# Patient Record
Sex: Female | Born: 1955
Health system: Southern US, Community
[De-identification: ages and names within clinical notes are randomized; demographics above are authoritative.]

## PROBLEM LIST (undated history)

## (undated) DIAGNOSIS — E785 Hyperlipidemia, unspecified: Secondary | ICD-10-CM

## (undated) DIAGNOSIS — E0500 Thyrotoxicosis with diffuse goiter without thyrotoxic crisis or storm: Secondary | ICD-10-CM

## (undated) DIAGNOSIS — E0590 Thyrotoxicosis, unspecified without thyrotoxic crisis or storm: Secondary | ICD-10-CM

## (undated) DIAGNOSIS — G43909 Migraine, unspecified, not intractable, without status migrainosus: Secondary | ICD-10-CM

## (undated) HISTORY — DX: Thyrotoxicosis, unspecified without thyrotoxic crisis or storm: E05.90

## (undated) HISTORY — PX: TUBAL LIGATION: SHX77

## (undated) HISTORY — PX: BREAST ENHANCEMENT SURGERY: SHX7

## (undated) HISTORY — DX: Migraine, unspecified, not intractable, without status migrainosus: G43.909

## (undated) HISTORY — PX: AUGMENTATION MAMMAPLASTY: SUR837

---

## 1998-05-01 ENCOUNTER — Other Ambulatory Visit: Admission: RE | Admit: 1998-05-01 | Discharge: 1998-05-01 | Payer: Self-pay | Admitting: Obstetrics & Gynecology

## 1999-05-14 ENCOUNTER — Other Ambulatory Visit: Admission: RE | Admit: 1999-05-14 | Discharge: 1999-05-14 | Payer: Self-pay | Admitting: Obstetrics & Gynecology

## 2000-10-21 ENCOUNTER — Other Ambulatory Visit: Admission: RE | Admit: 2000-10-21 | Discharge: 2000-10-21 | Payer: Self-pay | Admitting: Obstetrics & Gynecology

## 2001-07-12 ENCOUNTER — Other Ambulatory Visit: Admission: RE | Admit: 2001-07-12 | Discharge: 2001-07-12 | Payer: Self-pay | Admitting: Gynecology

## 2001-12-21 ENCOUNTER — Other Ambulatory Visit: Admission: RE | Admit: 2001-12-21 | Discharge: 2001-12-21 | Payer: Self-pay | Admitting: Obstetrics & Gynecology

## 2003-05-09 ENCOUNTER — Other Ambulatory Visit: Admission: RE | Admit: 2003-05-09 | Discharge: 2003-05-09 | Payer: Self-pay | Admitting: Obstetrics & Gynecology

## 2004-07-09 ENCOUNTER — Other Ambulatory Visit: Admission: RE | Admit: 2004-07-09 | Discharge: 2004-07-09 | Payer: Self-pay | Admitting: Obstetrics & Gynecology

## 2009-10-29 ENCOUNTER — Emergency Department (HOSPITAL_BASED_OUTPATIENT_CLINIC_OR_DEPARTMENT_OTHER): Admission: EM | Admit: 2009-10-29 | Discharge: 2009-10-29 | Payer: Self-pay | Admitting: Emergency Medicine

## 2009-10-29 ENCOUNTER — Ambulatory Visit: Payer: Self-pay | Admitting: Diagnostic Radiology

## 2011-09-26 ENCOUNTER — Ambulatory Visit (INDEPENDENT_AMBULATORY_CARE_PROVIDER_SITE_OTHER): Payer: No Typology Code available for payment source

## 2011-09-26 DIAGNOSIS — Z23 Encounter for immunization: Secondary | ICD-10-CM

## 2012-05-18 ENCOUNTER — Other Ambulatory Visit: Payer: Self-pay | Admitting: Obstetrics & Gynecology

## 2012-05-18 DIAGNOSIS — Z1231 Encounter for screening mammogram for malignant neoplasm of breast: Secondary | ICD-10-CM

## 2012-06-03 ENCOUNTER — Ambulatory Visit
Admission: RE | Admit: 2012-06-03 | Discharge: 2012-06-03 | Disposition: A | Discharge status: Home / Self Care | Payer: No Typology Code available for payment source | Source: Ambulatory Visit | Attending: Obstetrics & Gynecology | Admitting: Obstetrics & Gynecology

## 2012-06-03 DIAGNOSIS — Z1231 Encounter for screening mammogram for malignant neoplasm of breast: Secondary | ICD-10-CM

## 2014-11-06 LAB — HM MAMMOGRAPHY

## 2014-11-06 LAB — HM PAP SMEAR: HM PAP: NORMAL

## 2014-11-10 ENCOUNTER — Other Ambulatory Visit: Payer: Self-pay

## 2014-11-10 DIAGNOSIS — Z1231 Encounter for screening mammogram for malignant neoplasm of breast: Secondary | ICD-10-CM

## 2014-11-22 ENCOUNTER — Ambulatory Visit
Admission: RE | Admit: 2014-11-22 | Discharge: 2014-11-22 | Disposition: A | Discharge status: Home / Self Care | Payer: PRIVATE HEALTH INSURANCE | Source: Ambulatory Visit

## 2014-11-22 ENCOUNTER — Encounter (INDEPENDENT_AMBULATORY_CARE_PROVIDER_SITE_OTHER): Payer: Self-pay

## 2014-11-22 DIAGNOSIS — Z1231 Encounter for screening mammogram for malignant neoplasm of breast: Secondary | ICD-10-CM

## 2015-01-12 ENCOUNTER — Other Ambulatory Visit (INDEPENDENT_AMBULATORY_CARE_PROVIDER_SITE_OTHER): Payer: PRIVATE HEALTH INSURANCE

## 2015-01-12 ENCOUNTER — Encounter: Payer: Self-pay | Admitting: Family Medicine

## 2015-01-12 ENCOUNTER — Other Ambulatory Visit: Payer: Self-pay | Admitting: Family Medicine

## 2015-01-12 ENCOUNTER — Telehealth: Payer: Self-pay | Admitting: Family Medicine

## 2015-01-12 ENCOUNTER — Ambulatory Visit (INDEPENDENT_AMBULATORY_CARE_PROVIDER_SITE_OTHER): Payer: PRIVATE HEALTH INSURANCE | Admitting: Family Medicine

## 2015-01-12 VITALS — BP 112/80 | HR 112 | Temp 98.1°F | Resp 16 | Ht 65.0 in | Wt 130.4 lb

## 2015-01-12 DIAGNOSIS — R0602 Shortness of breath: Secondary | ICD-10-CM | POA: Diagnosis not present

## 2015-01-12 DIAGNOSIS — R748 Abnormal levels of other serum enzymes: Secondary | ICD-10-CM

## 2015-01-12 DIAGNOSIS — R790 Abnormal level of blood mineral: Secondary | ICD-10-CM

## 2015-01-12 DIAGNOSIS — R5383 Other fatigue: Secondary | ICD-10-CM | POA: Insufficient documentation

## 2015-01-12 DIAGNOSIS — E0590 Thyrotoxicosis, unspecified without thyrotoxic crisis or storm: Secondary | ICD-10-CM

## 2015-01-12 LAB — CBC WITH DIFFERENTIAL/PLATELET
BASOS PCT: 0.5 % (ref 0.0–3.0)
Basophils Absolute: 0 10*3/uL (ref 0.0–0.1)
Eosinophils Absolute: 0.2 10*3/uL (ref 0.0–0.7)
Eosinophils Relative: 4.2 % (ref 0.0–5.0)
HCT: 41 % (ref 36.0–46.0)
HEMOGLOBIN: 13.7 g/dL (ref 12.0–15.0)
LYMPHS PCT: 36.1 % (ref 12.0–46.0)
Lymphs Abs: 1.5 10*3/uL (ref 0.7–4.0)
MCHC: 33.4 g/dL (ref 30.0–36.0)
MCV: 78.8 fl (ref 78.0–100.0)
MONOS PCT: 9.9 % (ref 3.0–12.0)
Monocytes Absolute: 0.4 10*3/uL (ref 0.1–1.0)
NEUTROS ABS: 2 10*3/uL (ref 1.4–7.7)
Neutrophils Relative %: 49.3 % (ref 43.0–77.0)
Platelets: 201 10*3/uL (ref 150.0–400.0)
RBC: 5.21 Mil/uL — AB (ref 3.87–5.11)
RDW: 12.8 % (ref 11.5–15.5)
WBC: 4 10*3/uL (ref 4.0–10.5)

## 2015-01-12 LAB — BASIC METABOLIC PANEL
BUN: 15 mg/dL (ref 6–23)
CALCIUM: 10.5 mg/dL (ref 8.4–10.5)
CHLORIDE: 106 meq/L (ref 96–112)
CO2: 30 mEq/L (ref 19–32)
CREATININE: 0.56 mg/dL (ref 0.40–1.20)
GFR: 118.01 mL/min (ref >60.00)
Glucose, Bld: 102 mg/dL — ABNORMAL HIGH (ref 70–99)
Potassium: 5 mEq/L (ref 3.5–5.1)
SODIUM: 141 meq/L (ref 135–145)

## 2015-01-12 LAB — HEPATIC FUNCTION PANEL
ALK PHOS: 210 U/L — AB (ref 39–117)
ALT: 40 U/L — ABNORMAL HIGH (ref 0–35)
AST: 45 U/L — AB (ref 0–37)
Albumin: 3.8 g/dL (ref 3.5–5.2)
BILIRUBIN DIRECT: 0.1 mg/dL (ref 0.0–0.3)
BILIRUBIN TOTAL: 0.4 mg/dL (ref 0.2–1.2)
Total Protein: 7.6 g/dL (ref 6.0–8.3)

## 2015-01-12 LAB — T4: T4, Total: 15.7 ug/dL — ABNORMAL HIGH (ref 4.5–12.0)

## 2015-01-12 LAB — TSH: TSH: 0.03 u[IU]/mL — ABNORMAL LOW (ref 0.35–4.50)

## 2015-01-12 LAB — T3: T3 TOTAL: 361.6 ng/dL — AB (ref 80.0–204.0)

## 2015-01-12 LAB — GAMMA GT: GGT: 104 U/L — ABNORMAL HIGH (ref 7–51)

## 2015-01-12 NOTE — Progress Notes (Signed)
Pre visit review using our clinic review tool, if applicable. No additional management support is needed unless otherwise documented below in the visit note. 

## 2015-01-12 NOTE — Assessment & Plan Note (Signed)
New.  Pt has always been active and recently has found herself short of breath w/ moderate exertion.  No hx of pulmonary issues.  No CP.  Pt does have strong family hx of early CAD.  Has not had cardiac evaluation.  Check labs to r/o anemia, thyroid or electrolyte disorder.  EKG WNL.  Refer for stress testing.  Reviewed supportive care and red flags that should prompt return.  Pt expressed understanding and is in agreement w/ plan.

## 2015-01-12 NOTE — Telephone Encounter (Signed)
Caller name: Samara SnideGraham, Ceniya S Relation to pt: self  Call back number: [phone]   Reason for call:   Pt stated she missed 2 calls today and she had lab work. Pt stated she will have her phone near.

## 2015-01-12 NOTE — Telephone Encounter (Signed)
Pt was not called, labs were sent to her mychart.

## 2015-01-12 NOTE — Patient Instructions (Signed)
Schedule your complete physical in 6 months We'll notify you of your lab results and stress test and determine if sooner followup or intervention is needed Keep up the good work on healthy diet and regular activity- ease back into it! Call with any questions or concerns Hang in there! Welcome!  We're glad to have you!

## 2015-01-12 NOTE — Progress Notes (Signed)
   Subjective:    Patient ID: Kristina Nolan, female    DOB: 1956-04-09, 59 y.o.   MRN: [id number]  HPI New to establish.  No previous MD.   Clayton BiblesGYNArlyce Dice- [facility].  UTD on mammo/pap.  Has never had colonoscopy.  Pt had appt scheduled w/ Dr Madilyn FiremanHayes at [facility] but had to reschedule due to weather.  SOB/fatigue- pt reports that 18 months she was exercising regularly but stopped.  Has lost 15 lbs in 6 months w/o exercise but was 'really watching what I ate'.  Recently pt is SOB w/ moderate yard work, walking upstairs.  No fevers.  Denies night sweats.  Denies CP w/ SOB.  Denies increased stress/anxiety.  Denies cough.  + dry skin.  No hair loss, constipation.  Pt has normally rapid heartbeat.  + family hx of heart disease- brother at 7940, dad died at 6036.   Review of Systems For ROS see HPI     Objective:   Physical Exam  Constitutional: She is oriented to person, place, and time. She appears well-developed and well-nourished. No distress.  HENT:  Head: Normocephalic and atraumatic.  Eyes: Conjunctivae and EOM are normal. Pupils are equal, round, and reactive to light.  Neck: Normal range of motion. Neck supple. No thyromegaly present.  Cardiovascular: Normal rate, regular rhythm, normal heart sounds and intact distal pulses.   No murmur heard. Pulmonary/Chest: Effort normal and breath sounds normal. No respiratory distress.  Abdominal: Soft. She exhibits no distension. There is no tenderness.  Musculoskeletal: She exhibits no edema.  Lymphadenopathy:    She has no cervical adenopathy.  Neurological: She is alert and oriented to person, place, and time.  Skin: Skin is warm and dry.  Psychiatric: She has a normal mood and affect. Her behavior is normal.  Vitals reviewed.         Assessment & Plan:

## 2015-01-12 NOTE — Assessment & Plan Note (Signed)
New.  Pt reports increased fatigue recently w/o cause.  Not getting formal exercise and admits to some component of deconditioning.  Check labs to r/o thyroid abnormality, anemia, or other metabolic cause.  Pt expressed understanding and is in agreement w/ plan.

## 2015-01-15 ENCOUNTER — Encounter: Payer: Self-pay | Admitting: Family Medicine

## 2015-01-15 ENCOUNTER — Other Ambulatory Visit: Payer: Self-pay | Admitting: Family Medicine

## 2015-01-15 ENCOUNTER — Other Ambulatory Visit (INDEPENDENT_AMBULATORY_CARE_PROVIDER_SITE_OTHER): Payer: PRIVATE HEALTH INSURANCE

## 2015-01-15 ENCOUNTER — Telehealth: Payer: Self-pay | Admitting: General Practice

## 2015-01-15 DIAGNOSIS — R748 Abnormal levels of other serum enzymes: Secondary | ICD-10-CM

## 2015-01-15 DIAGNOSIS — E0590 Thyrotoxicosis, unspecified without thyrotoxic crisis or storm: Secondary | ICD-10-CM

## 2015-01-15 LAB — HEPATITIS PANEL, ACUTE
HCV Ab: NEGATIVE
HEP B C IGM: NONREACTIVE
HEP B S AG: NEGATIVE
Hep A IgM: NONREACTIVE

## 2015-01-15 NOTE — Telephone Encounter (Signed)
Called pt and LMOVM. I was given papers from the LAB and advised that pt wanted me to give these to her sister this afternoon. There is nothing listed in the DPR and no phone note. Cannot give these without a verbal from the pt.

## 2015-01-15 NOTE — Telephone Encounter (Signed)
Pt had labs drawn this morning. Referral to endo and orders for US were placed today.

## 2015-01-15 NOTE — Telephone Encounter (Signed)
Chart updated per Mychart message from pt. Gave the ok to add sister Cindie LarocheCathy wright [facility].

## 2015-01-16 ENCOUNTER — Ambulatory Visit (HOSPITAL_BASED_OUTPATIENT_CLINIC_OR_DEPARTMENT_OTHER)
Admission: RE | Admit: 2015-01-16 | Discharge: 2015-01-16 | Disposition: A | Discharge status: Home / Self Care | Payer: 59 | Source: Ambulatory Visit | Attending: Family Medicine | Admitting: Family Medicine

## 2015-01-16 DIAGNOSIS — R748 Abnormal levels of other serum enzymes: Secondary | ICD-10-CM | POA: Diagnosis present

## 2015-01-16 DIAGNOSIS — E039 Hypothyroidism, unspecified: Secondary | ICD-10-CM | POA: Insufficient documentation

## 2015-01-18 ENCOUNTER — Encounter: Payer: Self-pay | Admitting: Family Medicine

## 2015-02-05 ENCOUNTER — Ambulatory Visit (INDEPENDENT_AMBULATORY_CARE_PROVIDER_SITE_OTHER): Payer: PRIVATE HEALTH INSURANCE | Admitting: Endocrinology

## 2015-02-05 ENCOUNTER — Encounter: Payer: Self-pay | Admitting: Endocrinology

## 2015-02-05 VITALS — BP 128/62 | HR 127 | Temp 97.8°F | Resp 16 | Ht 65.0 in | Wt 130.8 lb

## 2015-02-05 DIAGNOSIS — E0590 Thyrotoxicosis, unspecified without thyrotoxic crisis or storm: Secondary | ICD-10-CM | POA: Insufficient documentation

## 2015-02-05 MED ORDER — METHIMAZOLE 10 MG PO TABS: 10.0000 mg | ORAL_TABLET | Freq: Two times a day (BID) | ORAL | Status: DC

## 2015-02-05 MED ORDER — ATENOLOL 50 MG PO TABS: 50.0000 mg | ORAL_TABLET | Freq: Every day | ORAL | Status: DC

## 2015-02-05 NOTE — Progress Notes (Signed)
Patient ID: Kristina Nolan, female   DOB: 20-Jul-1956, 59 y.o.   MRN: [id number]                                                                                           Reason for Appointment:  Hyperthyroidism, new consultation  Referring physician: Beverely Lowabori   History of Present Illness:   Since 2-3 months the patient has had symptoms of palpitations, occasional shakiness, feeling excessively warm and sweaty, nervousness, and fatigue. She says she gets exhausted with relatively small amounts of activity and also gets short of breath with exertion.  This appears to be her major symptom.  She likes to exercise and has not been able to do much.  She will get tired and out of breath even going upstairs She does think her legs may be weaker. She has had some thinning of her hair. Also complains of generalized itching. The patient has lost 18 about  lbs since these symptoms started, does seem to have some decreased appetite recently and some altered taste   The patient was evaluated in 01/2015 by her PCP for her initial exam with thyroid function tests which showed the following:     Lab Results  Component Value Date   TSH 0.03* 01/12/2015   T4 level: 15.7, T3: 362 in 01/2015  She is now referred here for further management    Medication List       This list is accurate as of: 02/05/15  8:51 PM.  Always use your most recent med list.               atenolol 50 MG tablet  Commonly known as:  TENORMIN  Take 1 tablet (50 mg total) by mouth daily.     methimazole 10 MG tablet  Commonly known as:  TAPAZOLE  Take 1 tablet (10 mg total) by mouth 2 (two) times daily.            Past Medical History  Diagnosis Date  . Migraines     Past Surgical History  Procedure Laterality Date  . Tubal ligation    . Breast enhancement surgery      Family History  Problem Relation Age of Onset  . Cancer Mother     lung  . Cancer Father    adrenal  . Heart attack Father   . Heart attack Brother   . Thyroid disease Cousin                            Social History:  reports that she has never smoked. She does not have any smokeless tobacco history on file. She reports that she does not drink alcohol or use illicit drugs.  Allergies: No Known Allergies  Review of Systems:  Skin: Itching present, no rash  Has no  history of high blood pressure.       She has a history of dyspnea on exertion.    No chest pain  on exertion    No complaints of change in bowel habits.      There is no history of Diabetes.     No swelling of legs present      No recent symptoms of depression No frequency of urination She has heat intolerance but no night sweats No joint pains   Examination:   BP 128/62 mmHg  Pulse 127  Temp(Src) 97.8 F (36.6 C)  Resp 16  Ht  (1.651 m)  Wt 130 lb 12.8 oz (59.33 kg)  BMI 21.77 kg/m2  SpO2 97%   General Appearance:  well-built and nourished, pleasant, not anxious.  Mildly hyperkinetic     Eyes: No excessive prominence,has mild lid lag and stare. No swelling of the eyelids  Neck: The thyroid is enlarged times about 1-1/2 normal, smooth, non-tendersoft, mostly on the right side There is no lymphadenopathy .          Heart: normal S1 and S2, no murmurs .          Lungs: breath sounds are clear bilaterally Abdomen: no hepatosplenomegaly or other palpable abnormality  Extremities: hands are warm. No ankle edema. Neurological: Deep tendon reflexes at biceps are very brisk. Minimal fine tremors are present.    Assessment/Plan:   Hyperthyroidism, Likely to be from Graves' disease   She has had a recent onset of multiple overt hyperthyroid symptoms and has obvious clinical signs also She does have only a small goiter on exam Her hyperthyroidism appears to be moderate in severity as judged by clinical symptoms and thyroid function levels   Discussed with the patient the hyperthyroidism as being an  autoimmune thyroid disease.  Explained the options for treatment including antithyroid drugs and radioactive iodine. Discussed the pros and cons for each treatment: Antithyroid drugs would be reasonable for mild disease but would need frequent followup with lab monitoring as well as potential for side effects from the medications and uncertainty about long-term cure of the problem Discussed that I-131 treatment is safe and simple to do but will result in long-term hypothyroidism that will result from ablation of the thyroid tissue and the need for lifelong supplementation and periodic monitoring.  Currently the patient is undecided on the modality of treatment and is favoring antithyroid drugs at least initially. She will start with Tapazole 10 mg twice a day and follow-up at the end of the month with repeat free T4 level, doses be adjusted further  Also for symptomatic control of her palpitations, shakiness and anxiety will have her start atenolol 50 mg daily  She agrees that if we are unable to start tapering her medication off in the next 3-4 months she will do I-131 treatment. Patient understands the above discussion and treatment options. All questions were answered satisfactorily  [facility] 02/05/2015, 8:51 PM

## 2015-02-05 NOTE — Patient Instructions (Signed)
ANTITHYROID DRUGS    Antithyroid drugs (also called thionamides) are most often used to treat an overactive thyroid (hyperthyroidism) such as caused by Graves' disease. These drugs work by blocking the formation of thyroid hormone by the thyroid gland  Antithyroid drugs have several benefits and a few side effects. It is important to learn as much as possible about the treatment of Graves' disease and to discuss all of the possible effects of antithyroid drugs with your doctor     FUNCTION OF ANTITHYROID DRUGS - Antithyroid drugs decrease the levels of the two hormones produced by the thyroid, thyroxine (T4) and triiodothyronine (T3).   Antithyroid drugs may be used: ?As a short-term treatment in people with Graves' hyperthyroidism, to prepare for thyroid surgery or radioiodine. ?As a long-term treatment. Approximately 30 percent of people with Graves' disease will have a remission after prolonged treatment with antithyroid drugs.  ?To treat hyperthyroidism associated with toxic multinodular goiter or a toxic adenoma ("hot nodule"), usually to prepare for thyroid surgery or radioiodine.  ?To treat women with hyperthyroidism during pregnancy.  You will need to take antithyroid drugs for at least three weeks (usually six to eight weeks or longer) to lower thyroid hormone levels. This is because they only block formation of new thyroid hormone; they do not remove thyroid hormones that are already in the thyroid and the blood stream. If you frequently miss taking the antithyroid drug, thyroid hormone synthesis may resume quickly and replenish thyroid gland stores, prolonging or preventing adequate control of the hyperthyroidism.  TYPES OF ANTITHYROID DRUGS - Two antithyroid drugs are currently available in the [place]: propylthiouracil (PTU) and methimazole (MMI, Tapazole).   Methimazole (MMI) - MMI is usually preferred over PTU because it reverses hyperthyroidism more quickly and has  fewer side effects. MMI requires an average of six weeks to lower T4 levels to normal and is often given before radioactive iodine treatment. MMI can be taken once per day. Propylthiouracil (PTU) - PTU does not reverse hyperthyroidism as rapidly as MMI and it has more side effects. Because of its potential for liver damage, it is used only when MMI is not appropriate. PTU must be taken two to three times per the day.  Antithyroid drugs during pregnancy - PTU used to be the drug of choice during pregnancy because it causes less severe birth defects than methimazole. But experts now recommend that PTU be given during the first trimester only. This is because there have been rare cases of liver damage in people taking PTU. After the first trimester, women should switch to methimazole for the rest of the pregnancy. For women who are nursing, methimazole is probably a better choice than PTU (to avoid liver side effects). If you take antithyroid drugs, you should discuss your treatment with your doctor before becoming pregnant. Having radioiodine treatment at least six months before becoming pregnant can eliminate the need for antithyroid treatment during pregnancy.  Antithyroid drug side effects - Most of the side effects of antithyroid drugs are minor, but major side effects can occur. Because there is no way to predict who will experience side effects, it is important to discuss all possible side effects before starting treatment.  If you cannot tolerate antithyroid treatments, you can consider radioiodine treatment or surgery.  Minor side effects - Up to 15 percent of people who take an antithyroid drug have minor side effects. Both MMI and PTU can cause itching, rash, hives, joint pain and swelling, fever, changes in taste, nausea, and   vomiting. If one antithyroid drug causes side effects, switching to the other drug may be helpful. However, about half of people who have side effects with one drug will  have similar side effects with the other. Nausea and vomiting may depend on the dose; spreading large doses out through the day can reduce side effects.  Major side effects - Fortunately, the major side effects of antithyroid drugs are very rare. ?Agranulocytosis - Agranulocytosis is a term used to describe a severe decrease in the production of white blood cells. This condition is extremely serious, but affects only one out of every 200 to 500 people who take an antithyroid drug. Elderly people taking PTU and those who take high doses of MMI may be at higher risk of this side effect.  Agranulocytosis more commonly occurs within the first three months of starting treatment with an antithyroid drug, but can occur at any time. If you develop a sore throat, fever, or other signs or symptoms of infection, you should stop your medicine and immediately call your doctor or nurse to have a complete blood count (CBC). Serious and potentially life threatening infections, or even death, can occur before agranulocytosis resolves. However, once the antithyroid drug is stopped, agranulocytosis usually resolves within a week. ?Other - There are three other very rare complications of antithyroid drugs: liver damage (more common with PTU), aplastic anemia (failure of the bone marrow to produce blood cells), and vasculitis (inflammation of blood vessels associated with PTU).  PTU-related liver damage typically occurs within three months of starting the drug. If you develop jaundice, dark urine, light stools, abdominal pain, loss of appetite, nausea, or other evidence of liver dysfunction, you should discontinue the drug immediately and contact your clinician for assessment of liver function. PTU-related liver failure can be serious and potentially life threatening.  The risk of liver damage from PTU is an important concern, particularly in children. For this reason, MMI is the first choice for treating  hyperthyroidism.  MONITORING THYROID HORMONES DURING TREATMENT - During treatment, your blood thyroid hormone levels will be monitored periodically, between every 3-8 weeks . Antithyroid drugs typically reduce levels of both triiodothyronine (T3) and thyroxine (T4), but levels of T3 may take longer to return to normal. Thyroid-stimulating hormone (TSH) levels usually take the longest to return to normal. About 30 percent of people who take an antithyroid drug for one to two years will have prolonged remission of Graves' disease. It is not known if the antithyroid drug plays an active role in this remission or if it simply controls thyroid hormone levels until Graves' disease resolves on its own.  Checking for remission and recurrence - No test can reliably predict remission of Graves' disease. While imperfect, the measurement of TSH-receptor antibodies is widely used in the [place] and [place] to determine if a person is in remission. Usually, after one to two years of treatment, you will stop taking the antithyroid drug. Blood tests are usually performed two to three weeks after stopping the antithyroid drug. The blood tests are periodically repeated over six months to determine if hormone levels remain normal or increase over time (this is called a recurrence). If your TSH level is lower than normal, you have had a recurrence of Graves' disease. This can occur within 10 days of stopping antithyroid drug treatment, or it can occur several years later. If your levels of T3, T4, and TSH remain normal for six months, the prognosis is good. Recurrence after this time occurs in   only 8 to 10 percent of people.  

## 2015-02-14 ENCOUNTER — Encounter: Payer: Self-pay | Admitting: Family Medicine

## 2015-02-14 DIAGNOSIS — E0590 Thyrotoxicosis, unspecified without thyrotoxic crisis or storm: Secondary | ICD-10-CM

## 2015-02-14 NOTE — Telephone Encounter (Signed)
Referral placed.

## 2015-02-26 ENCOUNTER — Ambulatory Visit: Payer: No Typology Code available for payment source | Admitting: Family Medicine

## 2015-03-14 ENCOUNTER — Other Ambulatory Visit (INDEPENDENT_AMBULATORY_CARE_PROVIDER_SITE_OTHER): Payer: PRIVATE HEALTH INSURANCE

## 2015-03-14 DIAGNOSIS — E0590 Thyrotoxicosis, unspecified without thyrotoxic crisis or storm: Secondary | ICD-10-CM | POA: Diagnosis not present

## 2015-03-14 LAB — T4, FREE: Free T4: 0.4 ng/dL — ABNORMAL LOW (ref 0.60–1.60)

## 2015-03-15 ENCOUNTER — Telehealth: Payer: Self-pay | Admitting: Family Medicine

## 2015-03-15 NOTE — Telephone Encounter (Signed)
Ov printed and faxed 03-15-15.

## 2015-03-15 NOTE — Telephone Encounter (Signed)
Caller name: Chenita at [facility] Ph# [phone] Fax# [phone]  Reason for call: Please fax copies of the last 2 office visits with notes and lab results as we have referred the pt to them.

## 2015-03-16 ENCOUNTER — Encounter: Payer: Self-pay | Admitting: Endocrinology

## 2015-03-16 ENCOUNTER — Ambulatory Visit (INDEPENDENT_AMBULATORY_CARE_PROVIDER_SITE_OTHER): Payer: PRIVATE HEALTH INSURANCE | Admitting: Endocrinology

## 2015-03-16 ENCOUNTER — Other Ambulatory Visit (INDEPENDENT_AMBULATORY_CARE_PROVIDER_SITE_OTHER): Payer: PRIVATE HEALTH INSURANCE

## 2015-03-16 ENCOUNTER — Other Ambulatory Visit: Payer: PRIVATE HEALTH INSURANCE

## 2015-03-16 VITALS — BP 118/66 | HR 75 | Temp 98.0°F | Resp 14 | Wt 139.4 lb

## 2015-03-16 DIAGNOSIS — R945 Abnormal results of liver function studies: Secondary | ICD-10-CM | POA: Diagnosis not present

## 2015-03-16 DIAGNOSIS — E0590 Thyrotoxicosis, unspecified without thyrotoxic crisis or storm: Secondary | ICD-10-CM | POA: Diagnosis not present

## 2015-03-16 NOTE — Patient Instructions (Signed)
Take atenolol 1/2 daily for 1 week then stop  Methimazole at dinner only: 1/2 alternating with 1 tab with food

## 2015-03-16 NOTE — Progress Notes (Signed)
Patient ID: Kristina Nolan, female   DOB: [birth year], 59 y.o.   MRN: [id number]                                                                                           Reason for Appointment:  Hyperthyroidism, follow-up  Referring physician: Beverely Low   History of Present Illness:   She presented with a 2 to three-month history of palpitations, occasional shakiness, feeling excessively warm, sweaty, nervousness, and fatigue. She worked get tired and out of breath even going upstairs. Also complains of generalized itching and 18 pound weight loss.  The patient was evaluated in 01/2015 by her PCP for her initial exam with thyroid function tests which showed a low TSH along with a total T3 of 362 and total T4 of 15.7 in 01/2015    She was not eager to consider I-131 treatment and has been started on methimazole 10 mg twice a day After about 2 weeks of starting the medication the patient started having significant improvement in her symptoms as above and currently feels back to normal with no fatigue, palpitations or heat intolerance She continues to take atenolol 50 mg daily  Follow-up labs are as follows:  Lab Results  Component Value Date   TSH 0.03* 01/12/2015   FREET4 0.40* 03/14/2015        Medication List       This list is accurate as of: 03/16/15  2:42 PM.  Always use your most recent med list.               atenolol 50 MG tablet  Commonly known as:  TENORMIN  Take 1 tablet (50 mg total) by mouth daily.     methimazole 10 MG tablet  Commonly known as:  TAPAZOLE  Take 1 tablet (10 mg total) by mouth 2 (two) times daily.     valACYclovir 500 MG tablet  Commonly known as:  VALTREX  TAKE 1 TABLET(S) EVERY DAY BY ORAL ROUTE.            Past Medical History  Diagnosis Date  . Migraines     Past Surgical History  Procedure Laterality Date  . Tubal ligation    . Breast enhancement surgery      Family History    Problem Relation Age of Onset  . Cancer Mother     lung  . Cancer Father     adrenal  . Heart attack Father   . Heart attack Brother   . Thyroid disease Cousin                            Social History:  reports that she has never smoked. She does not have any smokeless tobacco history on file. She reports that she does not drink alcohol or use illicit drugs.  Allergies: No Known Allergies  Review of Systems:     Examination:   BP 118/66 mmHg  Pulse 75  Temp(Src) 98 F (  36.7 C)  Resp 14  Wt 139 lb 6.4 oz (63.231 kg)  SpO2 97%  She is not anxious  Neck: The thyroid is nonpalpable Neurological: Deep tendon reflexes at biceps are slightly brisk No tremor     Assessment/Plan:   Hyperthyroidism, Likely to be from Graves' disease   She has had  excellent improvement in her symptoms with Tapazole 10 mg twice a day Although she had significantly high  pretreatment levels of T3 and also some increase in T4 levels she has responded with significantly lower free T4 levels, now in the hypothyroid range Also her thyroid enlargement appears to have resolved  She is she has responded somewhat dramatically to taking 20 mg Tapazole is unlikely that she has severe disease and may be able to get into remission with antithyroid drugs alone. Will reduce her methimazole to 7.5 mg daily in an also taper off her atenolol  She will follow-up in 4 weeks for reassessment   Roderica Cathell 03/16/2015, 2:42 PM

## 2015-03-19 LAB — HEPATIC FUNCTION PANEL
ALBUMIN: 4 g/dL (ref 3.5–5.2)
ALT: 20 U/L (ref 0–35)
AST: 33 U/L (ref 0–37)
Alkaline Phosphatase: 170 U/L — ABNORMAL HIGH (ref 39–117)
Bilirubin, Direct: 0 mg/dL (ref 0.0–0.3)
TOTAL PROTEIN: 7.4 g/dL (ref 6.0–8.3)
Total Bilirubin: 0.3 mg/dL (ref 0.2–1.2)

## 2015-04-10 ENCOUNTER — Other Ambulatory Visit: Payer: PRIVATE HEALTH INSURANCE

## 2015-04-16 ENCOUNTER — Ambulatory Visit: Payer: PRIVATE HEALTH INSURANCE | Admitting: Endocrinology

## 2015-05-02 ENCOUNTER — Other Ambulatory Visit: Payer: Self-pay | Admitting: Endocrinology

## 2015-05-23 ENCOUNTER — Telehealth: Payer: Self-pay | Admitting: Family Medicine

## 2015-05-23 NOTE — Telephone Encounter (Signed)
pre visit letter mailed 05/23/15 °

## 2015-06-12 ENCOUNTER — Telehealth: Payer: Self-pay | Admitting: Behavioral Health

## 2015-06-12 NOTE — Telephone Encounter (Signed)
Unable to reach patient at time of Pre-Visit Call.  Left message for patient to return call when available.    

## 2015-06-13 ENCOUNTER — Ambulatory Visit (INDEPENDENT_AMBULATORY_CARE_PROVIDER_SITE_OTHER): Payer: PRIVATE HEALTH INSURANCE | Admitting: Family Medicine

## 2015-06-13 ENCOUNTER — Encounter: Payer: PRIVATE HEALTH INSURANCE | Admitting: Family Medicine

## 2015-06-13 ENCOUNTER — Encounter: Payer: Self-pay | Admitting: Family Medicine

## 2015-06-13 VITALS — BP 120/72 | HR 88 | Temp 97.8°F | Resp 16 | Ht 65.0 in | Wt 148.2 lb

## 2015-06-13 DIAGNOSIS — Z1211 Encounter for screening for malignant neoplasm of colon: Secondary | ICD-10-CM

## 2015-06-13 DIAGNOSIS — Z0000 Encounter for general adult medical examination without abnormal findings: Secondary | ICD-10-CM

## 2015-06-13 DIAGNOSIS — E0590 Thyrotoxicosis, unspecified without thyrotoxic crisis or storm: Secondary | ICD-10-CM

## 2015-06-13 LAB — BASIC METABOLIC PANEL
BUN: 12 mg/dL (ref 6–23)
CALCIUM: 9.8 mg/dL (ref 8.4–10.5)
CO2: 32 mEq/L (ref 19–32)
Chloride: 104 mEq/L (ref 96–112)
Creatinine, Ser: 0.74 mg/dL (ref 0.40–1.20)
GFR: 85.43 mL/min (ref >60.00)
GLUCOSE: 88 mg/dL (ref 70–99)
Potassium: 4.7 mEq/L (ref 3.5–5.1)
SODIUM: 142 meq/L (ref 135–145)

## 2015-06-13 LAB — CBC WITH DIFFERENTIAL/PLATELET
BASOS ABS: 0 10*3/uL (ref 0.0–0.1)
Basophils Relative: 0.8 % (ref 0.0–3.0)
Eosinophils Absolute: 0.2 10*3/uL (ref 0.0–0.7)
Eosinophils Relative: 4.1 % (ref 0.0–5.0)
HEMATOCRIT: 41 % (ref 36.0–46.0)
Hemoglobin: 13.5 g/dL (ref 12.0–15.0)
LYMPHS PCT: 32.5 % (ref 12.0–46.0)
Lymphs Abs: 1.2 10*3/uL (ref 0.7–4.0)
MCHC: 32.9 g/dL (ref 30.0–36.0)
MCV: 82.9 fl (ref 78.0–100.0)
MONOS PCT: 8.5 % (ref 3.0–12.0)
Monocytes Absolute: 0.3 10*3/uL (ref 0.1–1.0)
NEUTROS ABS: 2 10*3/uL (ref 1.4–7.7)
Neutrophils Relative %: 54.1 % (ref 43.0–77.0)
Platelets: 206 10*3/uL (ref 150.0–400.0)
RBC: 4.94 Mil/uL (ref 3.87–5.11)
RDW: 14.1 % (ref 11.5–15.5)
WBC: 3.7 10*3/uL — ABNORMAL LOW (ref 4.0–10.5)

## 2015-06-13 LAB — T4, FREE: FREE T4: 0.83 ng/dL (ref 0.60–1.60)

## 2015-06-13 LAB — LIPID PANEL
CHOL/HDL RATIO: 3
CHOLESTEROL: 242 mg/dL — AB (ref 0–200)
HDL: 70.9 mg/dL (ref >39.00)
LDL CALC: 155 mg/dL — AB (ref 0–99)
NONHDL: 171.09
Triglycerides: 80 mg/dL (ref 0.0–149.0)
VLDL: 16 mg/dL (ref 0.0–40.0)

## 2015-06-13 LAB — T3, FREE: T3, Free: 3.3 pg/mL (ref 2.3–4.2)

## 2015-06-13 LAB — HEPATIC FUNCTION PANEL
ALBUMIN: 4.2 g/dL (ref 3.5–5.2)
ALK PHOS: 170 U/L — AB (ref 39–117)
ALT: 36 U/L — ABNORMAL HIGH (ref 0–35)
AST: 40 U/L — ABNORMAL HIGH (ref 0–37)
Bilirubin, Direct: 0.1 mg/dL (ref 0.0–0.3)
Total Bilirubin: 0.3 mg/dL (ref 0.2–1.2)
Total Protein: 7.6 g/dL (ref 6.0–8.3)

## 2015-06-13 LAB — VITAMIN D 25 HYDROXY (VIT D DEFICIENCY, FRACTURES): VITD: 24.85 ng/mL — ABNORMAL LOW (ref 30.00–100.00)

## 2015-06-13 LAB — TSH: TSH: 1.24 u[IU]/mL (ref 0.35–4.50)

## 2015-06-13 NOTE — Assessment & Plan Note (Signed)
Pt's PE WNL.  UTD on mammo, pap.  Due for colonoscopy.  Pt plans to schedule w/ [facility].  Check labs.  Anticipatory guidance provided.

## 2015-06-13 NOTE — Progress Notes (Signed)
Pre visit review using our clinic review tool, if applicable. No additional management support is needed unless otherwise documented below in the visit note. 

## 2015-06-13 NOTE — Progress Notes (Signed)
   Subjective:    Patient ID: Kristina Nolan, female    DOB: December 19, 1955, 59 y.o.   MRN: [id number]  HPI CPE- UTD on mammo, pap w/ Dr Arlyce Dice.  Seeing Dr Cleaster Corin ([facility]) for hyperthyroid.  Due for colonoscopy (Had appt w/ [facility]).     Review of Systems Patient reports no vision/ hearing changes, adenopathy,fever, weight change,  persistant/recurrent hoarseness , swallowing issues, chest pain, palpitations, edema, persistant/recurrent cough, hemoptysis, dyspnea (rest/exertional/paroxysmal nocturnal), gastrointestinal bleeding (melena, rectal bleeding), abdominal pain, significant heartburn, bowel changes, GU symptoms (dysuria, hematuria, incontinence), Gyn symptoms (abnormal  bleeding, pain),  syncope, focal weakness, memory loss, numbness & tingling, skin/hair/nail changes, abnormal bruising or bleeding, anxiety, or depression.     Objective:   Physical Exam General Appearance:    Alert, cooperative, no distress, appears stated age  Head:    Normocephalic, without obvious abnormality, atraumatic  Eyes:    PERRL, conjunctiva/corneas clear, EOM's intact, fundi    benign, both eyes  Ears:    Normal TM's and external ear canals, both ears  Nose:   Nares normal, septum midline, mucosa normal, no drainage    or sinus tenderness  Throat:   Lips, mucosa, and tongue normal; teeth and gums normal  Neck:   Supple, symmetrical, trachea midline, no adenopathy;    Thyroid: no enlargement/tenderness/nodules  Back:     Symmetric, no curvature, ROM normal, no CVA tenderness  Lungs:     Clear to auscultation bilaterally, respirations unlabored  Chest Wall:    No tenderness or deformity   Heart:    Regular rate and rhythm, S1 and S2 normal, no murmur, rub   or gallop  Breast Exam:    Deferred to GYN  Abdomen:     Soft, non-tender, bowel sounds active all four quadrants,    no masses, no organomegaly  Genitalia:    Deferred to GYN  Rectal:    Extremities:   Extremities normal, atraumatic, no cyanosis or  edema  Pulses:   2+ and symmetric all extremities  Skin:   Skin color, texture, turgor normal, no rashes or lesions  Lymph nodes:   Cervical, supraclavicular, and axillary nodes normal  Neurologic:   CNII-XII intact, normal strength, sensation and reflexes    throughout          Assessment & Plan:

## 2015-06-13 NOTE — Assessment & Plan Note (Signed)
Following at [facility].  Due for T3/T4 and TSH.  Orders entered.  Will fax once available.

## 2015-06-13 NOTE — Patient Instructions (Signed)
Follow up in 1 year or as needed We'll notify you of your lab results and make any changes if needed Call and schedule a colonoscopy consultation w/ GI Keep up the good work on healthy diet and try and get regular exercise Call with any questions or concerns Have a great fall!!

## 2015-06-15 ENCOUNTER — Other Ambulatory Visit: Payer: Self-pay | Admitting: General Practice

## 2015-06-15 DIAGNOSIS — E785 Hyperlipidemia, unspecified: Secondary | ICD-10-CM

## 2015-06-15 DIAGNOSIS — R748 Abnormal levels of other serum enzymes: Secondary | ICD-10-CM

## 2015-06-15 MED ORDER — VITAMIN D (ERGOCALCIFEROL) 1.25 MG (50000 UNIT) PO CAPS: 50000.0000 [IU] | ORAL_CAPSULE | ORAL | Status: DC

## 2015-06-15 MED ORDER — SIMVASTATIN 20 MG PO TABS: 20.0000 mg | ORAL_TABLET | Freq: Every day | ORAL | Status: DC

## 2015-07-11 ENCOUNTER — Other Ambulatory Visit: Payer: Self-pay | Admitting: Endocrinology

## 2015-08-03 ENCOUNTER — Encounter: Payer: Self-pay | Admitting: Family Medicine

## 2015-08-03 NOTE — Telephone Encounter (Signed)
Pease advise, per our records pt was to repeat LFT's in 6-8 weeks (last ov 06/13/15).   [facility] and just had Vitamin D labs on 06/13/15.

## 2015-08-08 DIAGNOSIS — E0500 Thyrotoxicosis with diffuse goiter without thyrotoxic crisis or storm: Secondary | ICD-10-CM | POA: Insufficient documentation

## 2016-02-12 ENCOUNTER — Other Ambulatory Visit: Payer: Self-pay

## 2016-02-12 DIAGNOSIS — Z1231 Encounter for screening mammogram for malignant neoplasm of breast: Secondary | ICD-10-CM

## 2016-02-25 ENCOUNTER — Ambulatory Visit
Admission: RE | Admit: 2016-02-25 | Discharge: 2016-02-25 | Disposition: A | Discharge status: Home / Self Care | Payer: No Typology Code available for payment source | Source: Ambulatory Visit

## 2016-02-25 DIAGNOSIS — Z1231 Encounter for screening mammogram for malignant neoplasm of breast: Secondary | ICD-10-CM

## 2016-06-16 ENCOUNTER — Encounter: Payer: Self-pay | Admitting: Family Medicine

## 2016-06-16 ENCOUNTER — Ambulatory Visit (INDEPENDENT_AMBULATORY_CARE_PROVIDER_SITE_OTHER): Payer: No Typology Code available for payment source | Admitting: Family Medicine

## 2016-06-16 ENCOUNTER — Encounter: Payer: Self-pay | Admitting: General Practice

## 2016-06-16 VITALS — BP 102/62 | HR 76 | Temp 98.1°F | Resp 16 | Ht 65.0 in | Wt 143.1 lb

## 2016-06-16 DIAGNOSIS — Z0000 Encounter for general adult medical examination without abnormal findings: Secondary | ICD-10-CM | POA: Diagnosis not present

## 2016-06-16 DIAGNOSIS — Z23 Encounter for immunization: Secondary | ICD-10-CM

## 2016-06-16 LAB — CBC WITH DIFFERENTIAL/PLATELET
BASOS ABS: 0 10*3/uL (ref 0.0–0.1)
Basophils Relative: 0.6 % (ref 0.0–3.0)
EOS PCT: 2.1 % (ref 0.0–5.0)
Eosinophils Absolute: 0.1 10*3/uL (ref 0.0–0.7)
HCT: 41 % (ref 36.0–46.0)
HEMOGLOBIN: 14 g/dL (ref 12.0–15.0)
LYMPHS PCT: 27.7 % (ref 12.0–46.0)
Lymphs Abs: 1.7 10*3/uL (ref 0.7–4.0)
MCHC: 34.2 g/dL (ref 30.0–36.0)
MCV: 80.8 fl (ref 78.0–100.0)
MONOS PCT: 7 % (ref 3.0–12.0)
Monocytes Absolute: 0.4 10*3/uL (ref 0.1–1.0)
Neutro Abs: 3.9 10*3/uL (ref 1.4–7.7)
Neutrophils Relative %: 62.6 % (ref 43.0–77.0)
Platelets: 211 10*3/uL (ref 150.0–400.0)
RBC: 5.08 Mil/uL (ref 3.87–5.11)
RDW: 13.4 % (ref 11.5–15.5)
WBC: 6.2 10*3/uL (ref 4.0–10.5)

## 2016-06-16 LAB — LIPID PANEL
CHOLESTEROL: 243 mg/dL — AB (ref 0–200)
HDL: 65.7 mg/dL (ref >39.00)
LDL Cholesterol: 150 mg/dL — ABNORMAL HIGH (ref 0–99)
NonHDL: 177.57
Total CHOL/HDL Ratio: 4
Triglycerides: 140 mg/dL (ref 0.0–149.0)
VLDL: 28 mg/dL (ref 0.0–40.0)

## 2016-06-16 LAB — HEPATIC FUNCTION PANEL
ALBUMIN: 4.5 g/dL (ref 3.5–5.2)
ALT: 42 U/L — ABNORMAL HIGH (ref 0–35)
AST: 42 U/L — ABNORMAL HIGH (ref 0–37)
Alkaline Phosphatase: 122 U/L — ABNORMAL HIGH (ref 39–117)
Bilirubin, Direct: 0.1 mg/dL (ref 0.0–0.3)
Total Bilirubin: 0.4 mg/dL (ref 0.2–1.2)
Total Protein: 7.5 g/dL (ref 6.0–8.3)

## 2016-06-16 LAB — BASIC METABOLIC PANEL
BUN: 16 mg/dL (ref 6–23)
CO2: 31 meq/L (ref 19–32)
Calcium: 9.9 mg/dL (ref 8.4–10.5)
Chloride: 103 mEq/L (ref 96–112)
Creatinine, Ser: 0.71 mg/dL (ref 0.40–1.20)
GFR: 89.3 mL/min (ref >60.00)
GLUCOSE: 81 mg/dL (ref 70–99)
POTASSIUM: 4.7 meq/L (ref 3.5–5.1)
SODIUM: 140 meq/L (ref 135–145)

## 2016-06-16 LAB — TSH: TSH: 1.99 u[IU]/mL (ref 0.35–4.50)

## 2016-06-16 LAB — VITAMIN D 25 HYDROXY (VIT D DEFICIENCY, FRACTURES): VITD: 33.89 ng/mL (ref 30.00–100.00)

## 2016-06-16 NOTE — Progress Notes (Signed)
Pre visit review using our clinic review tool, if applicable. No additional management support is needed unless otherwise documented below in the visit note. 

## 2016-06-16 NOTE — Progress Notes (Signed)
   Subjective:    Patient ID: Kristina Nolan, female    DOB: Jul 17, 1956, 60 y.o.   MRN: [id number]  HPI CPE- UTD on pap, mammo.  Due for colonoscopy (pt had this scheduled but MD needed to change appt- she plans to reschedule) and flu shot.       Review of Systems Patient reports no vision/ hearing changes, adenopathy,fever, weight change,  persistant/recurrent hoarseness , swallowing issues, chest pain, palpitations, edema, persistant/recurrent cough, hemoptysis, dyspnea (rest/exertional/paroxysmal nocturnal), gastrointestinal bleeding (melena, rectal bleeding), abdominal pain, significant heartburn, bowel changes, GU symptoms (dysuria, hematuria, incontinence), Gyn symptoms (abnormal  bleeding, pain),  syncope, focal weakness, memory loss, numbness & tingling, skin/hair/nail changes, abnormal bruising or bleeding, anxiety, or depression.     Objective:   Physical Exam General Appearance:    Alert, cooperative, no distress, appears stated age  Head:    Normocephalic, without obvious abnormality, atraumatic  Eyes:    PERRL, conjunctiva/corneas clear, EOM's intact, fundi    benign, both eyes  Ears:    Normal TM's and external ear canals, both ears  Nose:   Nares normal, septum midline, mucosa normal, no drainage    or sinus tenderness  Throat:   Lips, mucosa, and tongue normal; teeth and gums normal  Neck:   Supple, symmetrical, trachea midline, no adenopathy;    Thyroid: no enlargement/tenderness/nodules  Back:     Symmetric, no curvature, ROM normal, no CVA tenderness  Lungs:     Clear to auscultation bilaterally, respirations unlabored  Chest Wall:    No tenderness or deformity   Heart:    Regular rate and rhythm, S1 and S2 normal, no murmur, rub   or gallop  Breast Exam:    Deferred to GYN  Abdomen:     Soft, non-tender, bowel sounds active all four quadrants,    no masses, no organomegaly  Genitalia:    Deferred to GYN  Rectal:    Extremities:   Extremities normal, atraumatic, no  cyanosis or edema  Pulses:   2+ and symmetric all extremities  Skin:   Skin color, texture, turgor normal, no rashes or lesions  Lymph nodes:   Cervical, supraclavicular, and axillary nodes normal  Neurologic:   CNII-XII intact, normal strength, sensation and reflexes    throughout          Assessment & Plan:

## 2016-06-16 NOTE — Assessment & Plan Note (Signed)
Pt's PE WNL.  UTD on GYN.  Due for colonoscopy- just needs to call and reschedule.  Flu shot given today.  Check labs.  Anticipatory guidance provided.

## 2016-06-16 NOTE — Patient Instructions (Addendum)
Follow up in 1 year or as needed We'll notify you of your lab results and make any changes if needed Continue to work on healthy diet and regular exercise- you look great! Please call and reschedule your colonoscopy Call with any questions or concerns Happy Fall!!!

## 2016-12-29 ENCOUNTER — Ambulatory Visit (INDEPENDENT_AMBULATORY_CARE_PROVIDER_SITE_OTHER): Payer: 59 | Admitting: Family Medicine

## 2016-12-29 ENCOUNTER — Encounter: Payer: Self-pay | Admitting: Family Medicine

## 2016-12-29 VITALS — BP 120/79 | HR 88 | Temp 99.0°F | Resp 16 | Ht 65.0 in | Wt 149.1 lb

## 2016-12-29 DIAGNOSIS — J0100 Acute maxillary sinusitis, unspecified: Secondary | ICD-10-CM

## 2016-12-29 MED ORDER — AMOXICILLIN 875 MG PO TABS: 875.0000 mg | ORAL_TABLET | Freq: Two times a day (BID) | ORAL | 0 refills | Status: DC

## 2016-12-29 NOTE — Progress Notes (Signed)
   Subjective:    Patient ID: Kristina SnideNancy S Nolan, female    DOB: 06/11/1956, 61 y.o.   MRN: [id number]  HPI URI- sxs started ~5 days ago w/ yesterday being the worst.  + HA, bilateral ear fullness, tooth pain, sore neck, nasal congestion, cough.  + sinus pain/pressure.  Subjective fever last week.  No N/V.  No known sick contacts.   Review of Systems For ROS see HPI     Objective:   Physical Exam  Constitutional: She is oriented to person, place, and time. She appears well-developed and well-nourished. No distress.  HENT:  Head: Normocephalic and atraumatic.  Right Ear: Tympanic membrane normal.  Left Ear: Tympanic membrane normal.  Nose: Mucosal edema and rhinorrhea present. Right sinus exhibits maxillary sinus tenderness and frontal sinus tenderness. Left sinus exhibits maxillary sinus tenderness and frontal sinus tenderness.  Mouth/Throat: Uvula is midline and mucous membranes are normal. Posterior oropharyngeal erythema present. No oropharyngeal exudate.  Eyes: Conjunctivae and EOM are normal. Pupils are equal, round, and reactive to light.  Neck: Normal range of motion. Neck supple.  Cardiovascular: Normal rate, regular rhythm and normal heart sounds.   Pulmonary/Chest: Effort normal and breath sounds normal. No respiratory distress. She has no wheezes.  Lymphadenopathy:    She has no cervical adenopathy.  Neurological: She is alert and oriented to person, place, and time.  Skin: Skin is warm and dry.  Psychiatric: She has a normal mood and affect. Her behavior is normal. Thought content normal.  Vitals reviewed.         Assessment & Plan:  Maxillary sinusitis- new.  Pt's sxs and PE consistent w/ infxn.  Start abx.  Reviewed supportive care and red flags that should prompt return.  Pt expressed understanding and is in agreement w/ plan.

## 2016-12-29 NOTE — Patient Instructions (Signed)
Follow up as needed Start the Amoxicillin twice daily- take w/ food Drink plenty of fluids REST! Mucinex DM for chest congestion and cough Call with any questions or concerns Hang in there!!!

## 2016-12-29 NOTE — Progress Notes (Signed)
Pre visit review using our clinic review tool, if applicable. No additional management support is needed unless otherwise documented below in the visit note. 

## 2017-07-03 ENCOUNTER — Encounter: Payer: Self-pay | Admitting: Family Medicine

## 2017-07-06 ENCOUNTER — Encounter: Payer: Self-pay | Admitting: Family Medicine

## 2017-07-06 ENCOUNTER — Ambulatory Visit (INDEPENDENT_AMBULATORY_CARE_PROVIDER_SITE_OTHER): Payer: 59 | Admitting: Family Medicine

## 2017-07-06 VITALS — BP 112/78 | HR 78 | Temp 98.0°F | Resp 16 | Ht 65.0 in | Wt 154.0 lb

## 2017-07-06 DIAGNOSIS — Z0000 Encounter for general adult medical examination without abnormal findings: Secondary | ICD-10-CM

## 2017-07-06 DIAGNOSIS — Z23 Encounter for immunization: Secondary | ICD-10-CM | POA: Diagnosis not present

## 2017-07-06 DIAGNOSIS — E0590 Thyrotoxicosis, unspecified without thyrotoxic crisis or storm: Secondary | ICD-10-CM | POA: Diagnosis not present

## 2017-07-06 LAB — HEPATIC FUNCTION PANEL
ALBUMIN: 4.1 g/dL (ref 3.5–5.2)
ALK PHOS: 88 U/L (ref 39–117)
ALT: 18 U/L (ref 0–35)
AST: 29 U/L (ref 0–37)
BILIRUBIN DIRECT: 0.1 mg/dL (ref 0.0–0.3)
Total Bilirubin: 0.4 mg/dL (ref 0.2–1.2)
Total Protein: 6.7 g/dL (ref 6.0–8.3)

## 2017-07-06 LAB — CBC WITH DIFFERENTIAL/PLATELET
Basophils Absolute: 0.1 10*3/uL (ref 0.0–0.1)
Basophils Relative: 1.2 % (ref 0.0–3.0)
EOS PCT: 3.7 % (ref 0.0–5.0)
Eosinophils Absolute: 0.2 10*3/uL (ref 0.0–0.7)
HEMATOCRIT: 39.8 % (ref 36.0–46.0)
Hemoglobin: 13 g/dL (ref 12.0–15.0)
LYMPHS ABS: 1.4 10*3/uL (ref 0.7–4.0)
LYMPHS PCT: 30.6 % (ref 12.0–46.0)
MCHC: 32.6 g/dL (ref 30.0–36.0)
MCV: 84 fl (ref 78.0–100.0)
MONOS PCT: 7.4 % (ref 3.0–12.0)
Monocytes Absolute: 0.3 10*3/uL (ref 0.1–1.0)
Neutro Abs: 2.6 10*3/uL (ref 1.4–7.7)
Neutrophils Relative %: 57.1 % (ref 43.0–77.0)
Platelets: 237 10*3/uL (ref 150.0–400.0)
RBC: 4.74 Mil/uL (ref 3.87–5.11)
RDW: 13.7 % (ref 11.5–15.5)
WBC: 4.5 10*3/uL (ref 4.0–10.5)

## 2017-07-06 LAB — BASIC METABOLIC PANEL
BUN: 11 mg/dL (ref 6–23)
CALCIUM: 9.7 mg/dL (ref 8.4–10.5)
CO2: 31 mEq/L (ref 19–32)
Chloride: 103 mEq/L (ref 96–112)
Creatinine, Ser: 0.71 mg/dL (ref 0.40–1.20)
GFR: 88.99 mL/min (ref >60.00)
GLUCOSE: 87 mg/dL (ref 70–99)
Potassium: 4.8 mEq/L (ref 3.5–5.1)
SODIUM: 140 meq/L (ref 135–145)

## 2017-07-06 LAB — LIPID PANEL
CHOL/HDL RATIO: 4
Cholesterol: 250 mg/dL — ABNORMAL HIGH (ref 0–200)
HDL: 69.4 mg/dL (ref >39.00)
LDL CALC: 163 mg/dL — AB (ref 0–99)
NonHDL: 180.26
Triglycerides: 86 mg/dL (ref 0.0–149.0)
VLDL: 17.2 mg/dL (ref 0.0–40.0)

## 2017-07-06 LAB — TSH: TSH: 1.57 u[IU]/mL (ref 0.35–4.50)

## 2017-07-06 NOTE — Progress Notes (Signed)
Pre visit review using our clinic review tool, if applicable. No additional management support is needed unless otherwise documented below in the visit note. 

## 2017-07-06 NOTE — Assessment & Plan Note (Signed)
Pt's PE WNL.  Pt has GYN appt scheduled.  Overdue for colonoscopy- pt prefers Cologuard.  Check labs.  Anticipatory guidance provided.

## 2017-07-06 NOTE — Assessment & Plan Note (Signed)
Chronic problem.  Asymptomatic.  Check labs.

## 2017-07-06 NOTE — Patient Instructions (Addendum)
Follow up in 1 year or as needed We'll notify you of your lab results and make any changes if needed Continue to work on healthy diet and regular exercise- you look great!!! Please have them send me copies of your pap and mammo Call with any questions or concerns Happy Fall!!!

## 2017-07-06 NOTE — Progress Notes (Signed)
   Subjective:    Patient ID: Kristina Nolan, female    DOB: [birth year], 61 y.o.   MRN: [id number]  HPI CPE- pt has pap and mammo scheduled.  Overdue for colonoscopy- pt prefers cologuard.  Desires flu shot today.  UTD on Tdap.   Review of Systems Patient reports no vision/ hearing changes, adenopathy,fever, weight change,  persistant/recurrent hoarseness , swallowing issues, chest pain, palpitations, edema, persistant/recurrent cough, hemoptysis, dyspnea (rest/exertional/paroxysmal nocturnal), gastrointestinal bleeding (melena, rectal bleeding), abdominal pain, significant heartburn, bowel changes, GU symptoms (dysuria, hematuria, incontinence), Gyn symptoms (abnormal  bleeding, pain),  syncope, focal weakness, memory loss, numbness & tingling, skin/hair/nail changes, abnormal bruising or bleeding, anxiety, or depression.     Objective:   Physical Exam General Appearance:    Alert, cooperative, no distress, appears stated age  Head:    Normocephalic, without obvious abnormality, atraumatic  Eyes:    PERRL, conjunctiva/corneas clear, EOM's intact, fundi    benign, both eyes  Ears:    Normal TM's and external ear canals, both ears  Nose:   Nares normal, septum midline, mucosa normal, no drainage    or sinus tenderness  Throat:   Lips, mucosa, and tongue normal; teeth and gums normal  Neck:   Supple, symmetrical, trachea midline, no adenopathy;    Thyroid: no enlargement/tenderness/nodules  Back:     Symmetric, no curvature, ROM normal, no CVA tenderness  Lungs:     Clear to auscultation bilaterally, respirations unlabored  Chest Wall:    No tenderness or deformity   Heart:    Regular rate and rhythm, S1 and S2 normal, no murmur, rub   or gallop  Breast Exam:    Deferred to GYN  Abdomen:     Soft, non-tender, bowel sounds active all four quadrants,    no masses, no organomegaly  Genitalia:    Deferred to GYN  Rectal:    Extremities:   Extremities normal, atraumatic, no cyanosis or  edema  Pulses:   2+ and symmetric all extremities  Skin:   Skin color, texture, turgor normal, no rashes or lesions  Lymph nodes:   Cervical, supraclavicular, and axillary nodes normal  Neurologic:   CNII-XII intact, normal strength, sensation and reflexes    throughout          Assessment & Plan:

## 2017-07-07 ENCOUNTER — Other Ambulatory Visit: Payer: Self-pay | Admitting: General Practice

## 2017-07-07 DIAGNOSIS — E785 Hyperlipidemia, unspecified: Secondary | ICD-10-CM

## 2017-07-07 MED ORDER — ATORVASTATIN CALCIUM 20 MG PO TABS: 20.0000 mg | ORAL_TABLET | Freq: Every day | ORAL | 3 refills | Status: DC

## 2018-06-02 ENCOUNTER — Telehealth: Payer: Self-pay | Admitting: Emergency Medicine

## 2018-06-02 NOTE — Telephone Encounter (Signed)
Called pt and advised of PCP recommendations. Pt made appt for tomorrow at 1:45

## 2018-06-02 NOTE — Telephone Encounter (Signed)
Pt has her CPE scheduled for Oct but if she would like to check things prior to her CPE, we need to schedule an appt to review sxs, check vitals, and order labs

## 2018-06-02 NOTE — Telephone Encounter (Signed)
Copied from CRM [id number]. Topic: Appointment Scheduling - Prior Auth Required for Appointment >> Jun 02, 2018 12:25 PM Terisa Starraylor, Brittany L wrote: Patient would like to know could she come in and have her thyroid checked. She said she is hot all the time and feels like her TSH is very very low. She has no energy. Please advise.

## 2018-06-02 NOTE — Telephone Encounter (Signed)
Pt last seen on 07/06/17 for CPE. Please advise?

## 2018-06-03 ENCOUNTER — Ambulatory Visit: Payer: 59 | Admitting: Family Medicine

## 2018-06-03 ENCOUNTER — Encounter: Payer: Self-pay | Admitting: Family Medicine

## 2018-06-03 ENCOUNTER — Other Ambulatory Visit: Payer: Self-pay

## 2018-06-03 VITALS — BP 112/80 | HR 102 | Temp 98.0°F | Resp 16 | Ht 65.0 in | Wt 142.0 lb

## 2018-06-03 DIAGNOSIS — E0590 Thyrotoxicosis, unspecified without thyrotoxic crisis or storm: Secondary | ICD-10-CM

## 2018-06-03 DIAGNOSIS — Z23 Encounter for immunization: Secondary | ICD-10-CM | POA: Diagnosis not present

## 2018-06-03 LAB — T3, FREE: T3, Free: 9.7 pg/mL — ABNORMAL HIGH (ref 2.3–4.2)

## 2018-06-03 LAB — TSH: TSH: <0.01 u[IU]/mL — ABNORMAL LOW (ref 0.35–4.50)

## 2018-06-03 LAB — T4, FREE: Free T4: 1.99 ng/dL — ABNORMAL HIGH (ref 0.60–1.60)

## 2018-06-03 NOTE — Progress Notes (Signed)
   Subjective:    Patient ID: Kristina SnideNancy S Nolan, female    DOB: May 09, 1956, 62 y.o.   MRN: [id number]  HPI Hyperthyroid- pt was previously on Methimazole.  Has lost 12 lbs and HR is 102.  Pt reports 6 weeks ago started feeling hot.  Feels she has lost muscle mass.  + increased fatigue.  Increased thirst has led to increased water intake.  Denies palpitations.  Feels weak.  Intermittent feeling of jitteriness.   Review of Systems For ROS see HPI     Objective:   Physical Exam  Constitutional: She is oriented to person, place, and time. She appears well-developed and well-nourished. No distress.  HENT:  Head: Normocephalic and atraumatic.  Eyes: Pupils are equal, round, and reactive to light. Conjunctivae and EOM are normal.  Neck: Normal range of motion. Neck supple. No thyromegaly present.  Cardiovascular: Normal rate, regular rhythm, normal heart sounds and intact distal pulses.  No murmur heard. Pulmonary/Chest: Effort normal and breath sounds normal. No respiratory distress.  Musculoskeletal: She exhibits no edema.  Lymphadenopathy:    She has no cervical adenopathy.  Neurological: She is alert and oriented to person, place, and time. She displays normal reflexes. No cranial nerve deficit. She exhibits normal muscle tone. Coordination normal.  Skin: Skin is warm and dry.  Psychiatric: She has a normal mood and affect. Her behavior is normal.  Vitals reviewed.         Assessment & Plan:

## 2018-06-03 NOTE — Patient Instructions (Signed)
Follow up as needed or as scheduled We'll notify you of your lab results and make any changes if needed Once we know what we're dealing w/ we can determine the next steps Call with any questions or concerns Happy Labor Day!

## 2018-06-03 NOTE — Assessment & Plan Note (Signed)
Deteriorated.  Pt is again symptomatic.  We discussed if her thyroid is again overactive, she will need to resume care w/ Endo and discuss options going forward.  She asked about surgery vs iodine ablation vs controlling w/ medication.  We discussed all of these options.  Will continue to follow.

## 2018-06-04 ENCOUNTER — Encounter: Payer: Self-pay | Admitting: Family Medicine

## 2018-06-04 DIAGNOSIS — E0590 Thyrotoxicosis, unspecified without thyrotoxic crisis or storm: Secondary | ICD-10-CM

## 2018-06-06 LAB — HM PAP SMEAR

## 2018-06-10 ENCOUNTER — Ambulatory Visit: Payer: 59 | Admitting: Endocrinology

## 2018-06-29 ENCOUNTER — Encounter: Payer: Self-pay | Admitting: General Practice

## 2018-07-02 ENCOUNTER — Telehealth: Payer: Self-pay | Admitting: Family Medicine

## 2018-07-02 NOTE — Telephone Encounter (Signed)
If pt wants to see Selena Batten I'm fine with that...but it would have to be in one of his CPE slots and not jump the line.  Also, we could put her on a cancellation list.  But it is not appropriate to call patients and ask them to 'trade' appts with someone and I don't feel we should do that

## 2018-07-02 NOTE — Telephone Encounter (Signed)
Please advise if it would be ok to schedule with Kristina Nolan?  Copied from CRM [id number]. Topic: Appointment Scheduling - Scheduling Inquiry for Clinic >> Jul 02, 2018 10:55 AM Kristina Nolan, NT wrote: Reason for CRM: patient is calling and states she will be out of town on 07/08/18 for her physical appt and would like to know if the office could find someone to switch appointments with her. Dr. Beverely Low is booked out until Feb 2020 for physicals. Please contact patient.

## 2018-07-02 NOTE — Telephone Encounter (Signed)
I am fine seeing her if I have the openings.

## 2018-07-02 NOTE — Telephone Encounter (Signed)
Pt was advised that she could schedule an appt with Tabori and be placed on a waiting list or she could schedule an appt with Selena Batten. Pt states she will call back.

## 2018-07-08 ENCOUNTER — Encounter: Payer: Self-pay | Admitting: Family Medicine

## 2018-07-08 ENCOUNTER — Other Ambulatory Visit: Payer: Self-pay

## 2018-07-08 ENCOUNTER — Ambulatory Visit (INDEPENDENT_AMBULATORY_CARE_PROVIDER_SITE_OTHER): Payer: 59 | Admitting: Family Medicine

## 2018-07-08 VITALS — BP 121/80 | HR 72 | Temp 97.9°F | Resp 16 | Ht 65.0 in | Wt 142.0 lb

## 2018-07-08 DIAGNOSIS — Z0000 Encounter for general adult medical examination without abnormal findings: Secondary | ICD-10-CM

## 2018-07-08 DIAGNOSIS — E785 Hyperlipidemia, unspecified: Secondary | ICD-10-CM | POA: Diagnosis not present

## 2018-07-08 LAB — CBC WITH DIFFERENTIAL/PLATELET
BASOS ABS: 0 10*3/uL (ref 0.0–0.1)
Basophils Relative: 1.2 % (ref 0.0–3.0)
EOS PCT: 5 % (ref 0.0–5.0)
Eosinophils Absolute: 0.2 10*3/uL (ref 0.0–0.7)
HEMATOCRIT: 41.1 % (ref 36.0–46.0)
HEMOGLOBIN: 13.5 g/dL (ref 12.0–15.0)
LYMPHS PCT: 23.1 % (ref 12.0–46.0)
Lymphs Abs: 1 10*3/uL (ref 0.7–4.0)
MCHC: 32.9 g/dL (ref 30.0–36.0)
MCV: 78.8 fl (ref 78.0–100.0)
MONOS PCT: 9.4 % (ref 3.0–12.0)
Monocytes Absolute: 0.4 10*3/uL (ref 0.1–1.0)
Neutro Abs: 2.5 10*3/uL (ref 1.4–7.7)
Neutrophils Relative %: 61.3 % (ref 43.0–77.0)
Platelets: 217 10*3/uL (ref 150.0–400.0)
RBC: 5.21 Mil/uL — AB (ref 3.87–5.11)
RDW: 13.3 % (ref 11.5–15.5)
WBC: 4.1 10*3/uL (ref 4.0–10.5)

## 2018-07-08 LAB — BASIC METABOLIC PANEL
BUN: 18 mg/dL (ref 6–23)
CALCIUM: 9.7 mg/dL (ref 8.4–10.5)
CHLORIDE: 104 meq/L (ref 96–112)
CO2: 32 mEq/L (ref 19–32)
CREATININE: 0.65 mg/dL (ref 0.40–1.20)
GFR: 98.2 mL/min (ref >60.00)
Glucose, Bld: 94 mg/dL (ref 70–99)
Potassium: 4.6 mEq/L (ref 3.5–5.1)
Sodium: 141 mEq/L (ref 135–145)

## 2018-07-08 LAB — LIPID PANEL
CHOL/HDL RATIO: 4
Cholesterol: 240 mg/dL — ABNORMAL HIGH (ref 0–200)
HDL: 61.2 mg/dL (ref >39.00)
LDL Cholesterol: 156 mg/dL — ABNORMAL HIGH (ref 0–99)
NonHDL: 179.23
TRIGLYCERIDES: 115 mg/dL (ref 0.0–149.0)
VLDL: 23 mg/dL (ref 0.0–40.0)

## 2018-07-08 LAB — HEPATIC FUNCTION PANEL
ALT: 65 U/L — ABNORMAL HIGH (ref 0–35)
AST: 57 U/L — AB (ref 0–37)
Albumin: 4 g/dL (ref 3.5–5.2)
Alkaline Phosphatase: 191 U/L — ABNORMAL HIGH (ref 39–117)
BILIRUBIN TOTAL: 0.4 mg/dL (ref 0.2–1.2)
Bilirubin, Direct: 0.1 mg/dL (ref 0.0–0.3)
Total Protein: 7 g/dL (ref 6.0–8.3)

## 2018-07-08 LAB — TSH

## 2018-07-08 NOTE — Patient Instructions (Signed)
Follow up in 6 months to recheck cholesterol We'll notify you of your lab results and make any changes if needed [facility] and [facility]! COMPLETE the Cologuard when you are able Call with any questions or concerns Happy Fall!!!

## 2018-07-08 NOTE — Assessment & Plan Note (Signed)
Pt's PE WNL.  Due for mammo and needs to complete cologuard.  UTD on immunizations.  Check labs.  Anticipatory guidance provided.

## 2018-07-08 NOTE — Progress Notes (Signed)
   Subjective:    Patient ID: Kristina Nolan, female    DOB: 02-22-1956, 62 y.o.   MRN: [id number]  HPI CPE- due for mammo and colon cancer screen (has cologuard at home).  UTD on Tdap and flu.   Review of Systems Patient reports no vision/ hearing changes, adenopathy,fever, weight change,  persistant/recurrent hoarseness , swallowing issues, chest pain, palpitations, edema, persistant/recurrent cough, hemoptysis, dyspnea (rest/exertional/paroxysmal nocturnal), gastrointestinal bleeding (melena, rectal bleeding), abdominal pain, significant heartburn, bowel changes, GU symptoms (dysuria, hematuria, incontinence), Gyn symptoms (abnormal  bleeding, pain),  syncope, focal weakness, memory loss, numbness & tingling, skin/hair/nail changes, abnormal bruising or bleeding, anxiety, or depression.     Objective:   Physical Exam General Appearance:    Alert, cooperative, no distress, appears stated age  Head:    Normocephalic, without obvious abnormality, atraumatic  Eyes:    PERRL, conjunctiva/corneas clear, EOM's intact, fundi    benign, both eyes  Ears:    Normal TM's and external ear canals, both ears  Nose:   Nares normal, septum midline, mucosa normal, no drainage    or sinus tenderness  Throat:   Lips, mucosa, and tongue normal; teeth and gums normal  Neck:   Supple, symmetrical, trachea midline, no adenopathy;    Thyroid: no enlargement/tenderness/nodules  Back:     Symmetric, no curvature, ROM normal, no CVA tenderness  Lungs:     Clear to auscultation bilaterally, respirations unlabored  Chest Wall:    No tenderness or deformity   Heart:    Regular rate and rhythm, S1 and S2 normal, no murmur, rub   or gallop  Breast Exam:    Deferred to GYN  Abdomen:     Soft, non-tender, bowel sounds active all four quadrants,    no masses, no organomegaly  Genitalia:    Deferred to GYN  Rectal:    Extremities:   Extremities normal, atraumatic, no cyanosis or edema  Pulses:   2+ and symmetric all  extremities  Skin:   Skin color, texture, turgor normal, no rashes or lesions  Lymph nodes:   Cervical, supraclavicular, and axillary nodes normal  Neurologic:   CNII-XII intact, normal strength, sensation and reflexes    throughout          Assessment & Plan:

## 2018-07-08 NOTE — Assessment & Plan Note (Signed)
Chronic problem.  Pt is not taking prescribed statin.  Check labs and determine if meds are required.

## 2018-07-09 ENCOUNTER — Other Ambulatory Visit: Payer: Self-pay | Admitting: General Practice

## 2018-07-09 ENCOUNTER — Other Ambulatory Visit (INDEPENDENT_AMBULATORY_CARE_PROVIDER_SITE_OTHER): Payer: 59

## 2018-07-09 DIAGNOSIS — R945 Abnormal results of liver function studies: Secondary | ICD-10-CM

## 2018-07-09 DIAGNOSIS — R748 Abnormal levels of other serum enzymes: Secondary | ICD-10-CM

## 2018-07-09 DIAGNOSIS — R7989 Other specified abnormal findings of blood chemistry: Secondary | ICD-10-CM

## 2018-07-09 LAB — GAMMA GT: GGT: 101 U/L — ABNORMAL HIGH (ref 7–51)

## 2018-07-26 ENCOUNTER — Other Ambulatory Visit (INDEPENDENT_AMBULATORY_CARE_PROVIDER_SITE_OTHER): Payer: 59

## 2018-07-26 DIAGNOSIS — R7989 Other specified abnormal findings of blood chemistry: Secondary | ICD-10-CM

## 2018-07-26 DIAGNOSIS — R945 Abnormal results of liver function studies: Secondary | ICD-10-CM

## 2018-07-26 LAB — HEPATIC FUNCTION PANEL
ALT: 52 U/L — AB (ref 0–35)
AST: 53 U/L — AB (ref 0–37)
Albumin: 4.1 g/dL (ref 3.5–5.2)
Alkaline Phosphatase: 177 U/L — ABNORMAL HIGH (ref 39–117)
BILIRUBIN DIRECT: 0.1 mg/dL (ref 0.0–0.3)
BILIRUBIN TOTAL: 0.5 mg/dL (ref 0.2–1.2)
Total Protein: 7 g/dL (ref 6.0–8.3)

## 2018-07-27 ENCOUNTER — Encounter: Payer: Self-pay | Admitting: General Practice

## 2018-11-19 DIAGNOSIS — L820 Inflamed seborrheic keratosis: Secondary | ICD-10-CM | POA: Diagnosis not present

## 2018-11-19 DIAGNOSIS — L538 Other specified erythematous conditions: Secondary | ICD-10-CM | POA: Diagnosis not present

## 2019-01-10 DIAGNOSIS — E0500 Thyrotoxicosis with diffuse goiter without thyrotoxic crisis or storm: Secondary | ICD-10-CM | POA: Diagnosis not present

## 2019-01-12 ENCOUNTER — Telehealth: Payer: Self-pay | Admitting: Family Medicine

## 2019-01-12 NOTE — Telephone Encounter (Signed)
Copied from CRM [id number]. Topic: General - Other >> Jan 12, 2019  5:02 PM Tamela Oddi wrote: Reason for CRM: Patient called to request a call from the doctor or the nurse regarding her migraine headaches.  Patient stated she has some questions and would like to make an appointment for her headaches.  Please call back at [phone]

## 2019-01-13 ENCOUNTER — Ambulatory Visit (INDEPENDENT_AMBULATORY_CARE_PROVIDER_SITE_OTHER): Payer: BLUE CROSS/BLUE SHIELD | Admitting: Family Medicine

## 2019-01-13 ENCOUNTER — Other Ambulatory Visit: Payer: Self-pay

## 2019-01-13 ENCOUNTER — Encounter: Payer: Self-pay | Admitting: Family Medicine

## 2019-01-13 VITALS — BP 136/88 | HR 93

## 2019-01-13 DIAGNOSIS — E785 Hyperlipidemia, unspecified: Secondary | ICD-10-CM

## 2019-01-13 DIAGNOSIS — J0100 Acute maxillary sinusitis, unspecified: Secondary | ICD-10-CM | POA: Diagnosis not present

## 2019-01-13 DIAGNOSIS — E0500 Thyrotoxicosis with diffuse goiter without thyrotoxic crisis or storm: Secondary | ICD-10-CM | POA: Diagnosis not present

## 2019-01-13 DIAGNOSIS — G43909 Migraine, unspecified, not intractable, without status migrainosus: Secondary | ICD-10-CM | POA: Diagnosis not present

## 2019-01-13 MED ORDER — FLUTICASONE PROPIONATE 50 MCG/ACT NA SUSP: 2.0000 | Freq: Every day | NASAL | 6 refills | Status: DC

## 2019-01-13 MED ORDER — CETIRIZINE HCL 10 MG PO TABS: 10.0000 mg | ORAL_TABLET | Freq: Every day | ORAL | 11 refills | Status: DC

## 2019-01-13 NOTE — Progress Notes (Signed)
   Virtual Visit via Video   I connected with Kristina Nolan on 01/13/19 at  1:00 PM EDT by a video enabled telemedicine application and verified that I am speaking with the correct person using two identifiers. Location patient: Home Location provider: [facility], Office Persons participating in the virtual visit: pt and myself  I discussed the limitations of evaluation and management by telemedicine and the availability of in person appointments. The patient expressed understanding and agreed to proceed.  Subjective:   HPI:  HA- pt has hx of migraines.  Woke w/ 'bad migraine' on Monday.  Went to work and felt better.  Tuesday again woke up w/ migraine.  Took Advil and went to work.  Yesterday felt good, went to work, and developed some blurry vision around 4:30 yesterday afternoon (typical for her migraines), left work, took Advil and went to bed.  'I don't like taking medicine'.  Pt reports not a typical migraine- + sinus pain, dryness.  Last migraine was 3-4 months ago.  No associated nausea.  Pt reports the headaches this week were more sinus than usual and bilateral.  Denies facial pain/pressure.  + tooth pain earlier this week.  No headache yet today  Hyperlipidemia- pt had elevated cholesterol in the fall but we were holding on statin due to increased LFTs from methimazole.  Pt would like to have cholesterol rechecked and start meds if needed  ROS: See pertinent positives and negatives per HPI.  Patient Active Problem List   Diagnosis Date Noted  . Hyperlipidemia 07/08/2018  . Graves' disease 08/08/2015  . Physical exam 06/13/2015  . Hyperthyroidism 02/05/2015  . SOB (shortness of breath) on exertion 01/12/2015    Social History   Tobacco Use  . Smoking status: Never Smoker  . Smokeless tobacco: Never Used  Substance Use Topics  . Alcohol use: No    Current Outpatient Medications:  .  atenolol (TENORMIN) 25 MG tablet, Take 25 mg by mouth daily., Disp: , Rfl:  .   atorvastatin (LIPITOR) 20 MG tablet, Take 1 tablet (20 mg total) by mouth daily. (Patient not taking: Reported on 06/03/2018), Disp: 30 tablet, Rfl: 3 .  methimazole (TAPAZOLE) 5 MG tablet, Take 5 mg by mouth daily., Disp: , Rfl:  .  valACYclovir (VALTREX) 500 MG tablet, TAKE 1 TABLET(S) EVERY DAY BY ORAL ROUTE., Disp: , Rfl: 10  No Known Allergies  Objective:   BP 136/88   Pulse 93  AAOx3, NAD NCAT, EOMI No obvious CN deficits Coloring WNL Pt is able to speak clearly, coherently without shortness of breath or increased work of breathing.  Thought process is linear.  Mood is appropriate.   Assessment and Plan:   Sinusitis- new.  Based on pt's sxs, she seems to have sinus inflammation rather than infection.  Suspect the sinus inflammation triggered a true migraine yesterday.  Start daily antihistamine and nasal steroid.  Pt expressed understanding and is in agreement w/ plan.   Migraine- suspect only yesterday's HA was a true migraine and that it was triggered by sinus pressure.  Ibuprofen as needed and pt to start a migraine log to determine frequency and severity of sxs so we can determine whether additional workup is needed.  Hyperlipidemia- pt will return for labs to determine if medication is needed.  Pt expressed understanding and is in agreement w/ plan.    Neena Rhymes, MD 01/13/2019

## 2019-01-17 ENCOUNTER — Other Ambulatory Visit: Payer: BLUE CROSS/BLUE SHIELD

## 2019-01-18 ENCOUNTER — Other Ambulatory Visit (INDEPENDENT_AMBULATORY_CARE_PROVIDER_SITE_OTHER): Payer: BLUE CROSS/BLUE SHIELD

## 2019-01-18 DIAGNOSIS — E785 Hyperlipidemia, unspecified: Secondary | ICD-10-CM

## 2019-01-18 DIAGNOSIS — E0500 Thyrotoxicosis with diffuse goiter without thyrotoxic crisis or storm: Secondary | ICD-10-CM

## 2019-01-18 LAB — T3, FREE: T3, Free: 3.4 pg/mL (ref 2.3–4.2)

## 2019-01-18 LAB — BASIC METABOLIC PANEL
BUN: 13 mg/dL (ref 6–23)
CO2: 29 mEq/L (ref 19–32)
Calcium: 9.6 mg/dL (ref 8.4–10.5)
Chloride: 105 mEq/L (ref 96–112)
Creatinine, Ser: 0.72 mg/dL (ref 0.40–1.20)
GFR: 81.97 mL/min (ref >60.00)
Glucose, Bld: 94 mg/dL (ref 70–99)
Potassium: 5 mEq/L (ref 3.5–5.1)
Sodium: 141 mEq/L (ref 135–145)

## 2019-01-18 LAB — LIPID PANEL
Cholesterol: 241 mg/dL — ABNORMAL HIGH (ref 0–200)
HDL: 65.8 mg/dL (ref >39.00)
LDL Cholesterol: 152 mg/dL — ABNORMAL HIGH (ref 0–99)
NonHDL: 174.92
Total CHOL/HDL Ratio: 4
Triglycerides: 113 mg/dL (ref 0.0–149.0)
VLDL: 22.6 mg/dL (ref 0.0–40.0)

## 2019-01-18 LAB — HEPATIC FUNCTION PANEL
ALT: 41 U/L — ABNORMAL HIGH (ref 0–35)
AST: 41 U/L — ABNORMAL HIGH (ref 0–37)
Albumin: 4 g/dL (ref 3.5–5.2)
Alkaline Phosphatase: 119 U/L — ABNORMAL HIGH (ref 39–117)
Bilirubin, Direct: 0.1 mg/dL (ref 0.0–0.3)
Total Bilirubin: 0.3 mg/dL (ref 0.2–1.2)
Total Protein: 6.8 g/dL (ref 6.0–8.3)

## 2019-01-18 LAB — TSH: TSH: 1.69 u[IU]/mL (ref 0.35–4.50)

## 2019-01-18 LAB — T4, FREE: Free T4: 0.79 ng/dL (ref 0.60–1.60)

## 2019-01-27 ENCOUNTER — Encounter: Payer: Self-pay | Admitting: Family Medicine

## 2019-01-27 ENCOUNTER — Ambulatory Visit (INDEPENDENT_AMBULATORY_CARE_PROVIDER_SITE_OTHER): Payer: BLUE CROSS/BLUE SHIELD | Admitting: Family Medicine

## 2019-01-27 VITALS — Ht 65.0 in

## 2019-01-27 DIAGNOSIS — H9201 Otalgia, right ear: Secondary | ICD-10-CM

## 2019-01-27 DIAGNOSIS — E785 Hyperlipidemia, unspecified: Secondary | ICD-10-CM

## 2019-01-27 MED ORDER — AMOXICILLIN 875 MG PO TABS: 875.0000 mg | ORAL_TABLET | Freq: Two times a day (BID) | ORAL | 0 refills | Status: DC

## 2019-01-27 MED ORDER — ATORVASTATIN CALCIUM 20 MG PO TABS: 20.0000 mg | ORAL_TABLET | Freq: Every day | ORAL | 3 refills | Status: DC

## 2019-01-27 NOTE — Progress Notes (Signed)
   Virtual Visit via Video   I connected with patient on 01/27/19 at 10:20 AM EDT by a video enabled telemedicine application and verified that I am speaking with the correct person using two identifiers.  Location patient: Home Location provider: [place], Office Persons participating in the virtual visit: Patient, Provider, CMA [facility])  I discussed the limitations of evaluation and management by telemedicine and the availability of in person appointments. The patient expressed understanding and agreed to proceed.  Subjective:   HPI:   Ear pain- R sided, started 5-6 days ago.  'it was extremely painful Saturday night'.  Not painful to touch and pain comes and goes.  Tenderness behind R ear and down into neck.  'it feels full'.  No drainage.  No fevers.  No pain on L side.    ROS:   See pertinent positives and negatives per HPI.  Patient Active Problem List   Diagnosis Date Noted  . Hyperlipidemia 07/08/2018  . Graves' disease 08/08/2015  . Physical exam 06/13/2015  . Hyperthyroidism 02/05/2015  . SOB (shortness of breath) on exertion 01/12/2015    Social History   Tobacco Use  . Smoking status: Never Smoker  . Smokeless tobacco: Never Used  Substance Use Topics  . Alcohol use: No    Current Outpatient Medications:  .  cetirizine (ZYRTEC) 10 MG tablet, Take 1 tablet (10 mg total) by mouth daily., Disp: 30 tablet, Rfl: 11 .  fluticasone (FLONASE) 50 MCG/ACT nasal spray, Place 2 sprays into both nostrils daily., Disp: 16 g, Rfl: 6 .  valACYclovir (VALTREX) 500 MG tablet, TAKE 1 TABLET(S) EVERY DAY BY ORAL ROUTE., Disp: , Rfl: 10 .  atenolol (TENORMIN) 25 MG tablet, Take 25 mg by mouth daily., Disp: , Rfl:  .  atorvastatin (LIPITOR) 20 MG tablet, Take 1 tablet (20 mg total) by mouth daily. (Patient not taking: Reported on 06/03/2018), Disp: 30 tablet, Rfl: 3 .  methimazole (TAPAZOLE) 5 MG tablet, Take 5 mg by mouth daily., Disp: , Rfl:   No Known  Allergies  Objective:   Ht 5\' 5"  (1.651 m)   BMI 23.63 kg/m   AAOx3, NAD NCAT, EOMI No obvious CN deficits Coloring WNL Pt is able to speak clearly, coherently without shortness of breath or increased work of breathing.  Thought process is linear.  Mood is appropriate.   Assessment and Plan:   R ear pain- new.  Given that pt was seen on 4/9 w/ seasonal allergies and has subsequently developed R ear pain and pressure, I suspect she may have OM from a blocked eustachian tube.  Start Amox.  If no improvement will need OV to assess.  Pt expressed understanding and is in agreement w/ plan.   Hyperlipidemia- cholesterol medication sent to pharmacy.   Neena Rhymes, MD 01/27/2019

## 2019-01-27 NOTE — Progress Notes (Signed)
I have discussed the procedure for the virtual visit with the patient who has given consent to proceed with assessment and treatment.   BETHANY DILLARD, CMA     

## 2019-02-04 ENCOUNTER — Other Ambulatory Visit: Payer: Self-pay

## 2019-02-04 ENCOUNTER — Encounter: Payer: Self-pay | Admitting: Family Medicine

## 2019-02-04 ENCOUNTER — Ambulatory Visit (INDEPENDENT_AMBULATORY_CARE_PROVIDER_SITE_OTHER): Payer: BLUE CROSS/BLUE SHIELD | Admitting: Family Medicine

## 2019-02-04 VITALS — BP 122/78 | HR 84 | Temp 98.3°F | Resp 14 | Ht 65.0 in | Wt 150.0 lb

## 2019-02-04 DIAGNOSIS — H6981 Other specified disorders of Eustachian tube, right ear: Secondary | ICD-10-CM

## 2019-02-04 NOTE — Progress Notes (Signed)
   Subjective:    Patient ID: Kristina Nolan, female    DOB: [birth year], 63 y.o.   MRN: [id number]  HPI R ear pain- pt was started on Amoxicillin on 4/23 for ongoing R ear pain.  This is now 2 weeks of sxs.  She reports pain was improving on abx and then worsened 2 days ago.  She had stopped taking allergy medication and using nasal steroid. No fevers.  No known sick contacts.   Review of Systems For ROS see HPI     Objective:   Physical Exam Vitals signs reviewed.  Constitutional:      General: She is not in acute distress.    Appearance: Normal appearance. She is well-developed.  HENT:     Head: Normocephalic and atraumatic.     Right Ear: Tympanic membrane is retracted.     Left Ear: Tympanic membrane normal.     Nose: Mucosal edema and rhinorrhea present.     Right Sinus: No maxillary sinus tenderness or frontal sinus tenderness.     Left Sinus: No maxillary sinus tenderness or frontal sinus tenderness.     Mouth/Throat:     Pharynx: Posterior oropharyngeal erythema (w/ PND) present.  Eyes:     Conjunctiva/sclera: Conjunctivae normal.     Pupils: Pupils are equal, round, and reactive to light.  Neck:     Musculoskeletal: Normal range of motion and neck supple.  Cardiovascular:     Rate and Rhythm: Normal rate and regular rhythm.     Heart sounds: Normal heart sounds.  Pulmonary:     Effort: Pulmonary effort is normal. No respiratory distress.     Breath sounds: Normal breath sounds. No wheezing or rales.  Lymphadenopathy:     Cervical: No cervical adenopathy.  Neurological:     Mental Status: She is alert.           Assessment & Plan:  Eustachian tube dysfxn- new.  Reviewed dx and tx w/ pt.  She will restart daily antihistamine and nasal steroid.  Reviewed supportive care and red flags that should prompt return.  Pt expressed understanding and is in agreement w/ plan.

## 2019-03-15 DIAGNOSIS — L820 Inflamed seborrheic keratosis: Secondary | ICD-10-CM | POA: Diagnosis not present

## 2019-03-15 DIAGNOSIS — L538 Other specified erythematous conditions: Secondary | ICD-10-CM | POA: Diagnosis not present

## 2019-03-15 DIAGNOSIS — L814 Other melanin hyperpigmentation: Secondary | ICD-10-CM | POA: Diagnosis not present

## 2019-03-15 DIAGNOSIS — L821 Other seborrheic keratosis: Secondary | ICD-10-CM | POA: Diagnosis not present

## 2019-03-15 DIAGNOSIS — D225 Melanocytic nevi of trunk: Secondary | ICD-10-CM | POA: Diagnosis not present

## 2019-03-15 DIAGNOSIS — R202 Paresthesia of skin: Secondary | ICD-10-CM | POA: Diagnosis not present

## 2019-04-21 ENCOUNTER — Other Ambulatory Visit: Payer: Self-pay | Admitting: Family Medicine

## 2019-07-12 DIAGNOSIS — E0500 Thyrotoxicosis with diffuse goiter without thyrotoxic crisis or storm: Secondary | ICD-10-CM | POA: Diagnosis not present

## 2020-04-05 ENCOUNTER — Other Ambulatory Visit: Payer: Self-pay | Admitting: Family Medicine

## 2020-07-10 DIAGNOSIS — E0500 Thyrotoxicosis with diffuse goiter without thyrotoxic crisis or storm: Secondary | ICD-10-CM | POA: Diagnosis not present

## 2020-07-10 DIAGNOSIS — R945 Abnormal results of liver function studies: Secondary | ICD-10-CM | POA: Diagnosis not present

## 2020-07-12 ENCOUNTER — Encounter: Payer: Self-pay | Admitting: Family Medicine

## 2020-07-12 ENCOUNTER — Other Ambulatory Visit: Payer: Self-pay

## 2020-07-12 ENCOUNTER — Ambulatory Visit (INDEPENDENT_AMBULATORY_CARE_PROVIDER_SITE_OTHER): Payer: BC Managed Care – PPO | Admitting: Family Medicine

## 2020-07-12 VITALS — BP 124/83 | HR 70 | Temp 98.0°F | Resp 16 | Ht 65.0 in | Wt 153.4 lb

## 2020-07-12 DIAGNOSIS — E559 Vitamin D deficiency, unspecified: Secondary | ICD-10-CM

## 2020-07-12 DIAGNOSIS — Z0000 Encounter for general adult medical examination without abnormal findings: Secondary | ICD-10-CM

## 2020-07-12 DIAGNOSIS — Z23 Encounter for immunization: Secondary | ICD-10-CM

## 2020-07-12 DIAGNOSIS — Z780 Asymptomatic menopausal state: Secondary | ICD-10-CM

## 2020-07-12 DIAGNOSIS — Z1231 Encounter for screening mammogram for malignant neoplasm of breast: Secondary | ICD-10-CM

## 2020-07-12 DIAGNOSIS — E785 Hyperlipidemia, unspecified: Secondary | ICD-10-CM

## 2020-07-12 LAB — HEPATIC FUNCTION PANEL
ALT: 29 U/L (ref 0–35)
AST: 27 U/L (ref 0–37)
Albumin: 4.4 g/dL (ref 3.5–5.2)
Alkaline Phosphatase: 104 U/L (ref 39–117)
Bilirubin, Direct: 0.1 mg/dL (ref 0.0–0.3)
Total Bilirubin: 0.4 mg/dL (ref 0.2–1.2)
Total Protein: 7.2 g/dL (ref 6.0–8.3)

## 2020-07-12 LAB — CBC WITH DIFFERENTIAL/PLATELET
Basophils Absolute: 0.1 10*3/uL (ref 0.0–0.1)
Basophils Relative: 1.9 % (ref 0.0–3.0)
Eosinophils Absolute: 0.2 10*3/uL (ref 0.0–0.7)
Eosinophils Relative: 3.9 % (ref 0.0–5.0)
HCT: 42.7 % (ref 36.0–46.0)
Hemoglobin: 14.1 g/dL (ref 12.0–15.0)
Lymphocytes Relative: 32.4 % (ref 12.0–46.0)
Lymphs Abs: 1.4 10*3/uL (ref 0.7–4.0)
MCHC: 33.1 g/dL (ref 30.0–36.0)
MCV: 85.9 fl (ref 78.0–100.0)
Monocytes Absolute: 0.4 10*3/uL (ref 0.1–1.0)
Monocytes Relative: 9.2 % (ref 3.0–12.0)
Neutro Abs: 2.3 10*3/uL (ref 1.4–7.7)
Neutrophils Relative %: 52.6 % (ref 43.0–77.0)
Platelets: 229 10*3/uL (ref 150.0–400.0)
RBC: 4.97 Mil/uL (ref 3.87–5.11)
RDW: 13.3 % (ref 11.5–15.5)
WBC: 4.3 10*3/uL (ref 4.0–10.5)

## 2020-07-12 LAB — BASIC METABOLIC PANEL
BUN: 18 mg/dL (ref 6–23)
CO2: 31 mEq/L (ref 19–32)
Calcium: 9.9 mg/dL (ref 8.4–10.5)
Chloride: 104 mEq/L (ref 96–112)
Creatinine, Ser: 0.77 mg/dL (ref 0.40–1.20)
GFR: 81.66 mL/min (ref >60.00)
Glucose, Bld: 84 mg/dL (ref 70–99)
Potassium: 4.9 mEq/L (ref 3.5–5.1)
Sodium: 140 mEq/L (ref 135–145)

## 2020-07-12 LAB — VITAMIN D 25 HYDROXY (VIT D DEFICIENCY, FRACTURES): VITD: 41.36 ng/mL (ref 30.00–100.00)

## 2020-07-12 LAB — LIPID PANEL
Cholesterol: 255 mg/dL — ABNORMAL HIGH (ref 0–200)
HDL: 66.5 mg/dL (ref >39.00)
LDL Cholesterol: 170 mg/dL — ABNORMAL HIGH (ref 0–99)
NonHDL: 188.59
Total CHOL/HDL Ratio: 4
Triglycerides: 91 mg/dL (ref 0.0–149.0)
VLDL: 18.2 mg/dL (ref 0.0–40.0)

## 2020-07-12 LAB — TSH: TSH: 1.3 u[IU]/mL (ref 0.35–4.50)

## 2020-07-12 MED ORDER — CETIRIZINE HCL 10 MG PO TABS
10.0000 mg | ORAL_TABLET | Freq: Every day | ORAL | 11 refills | Status: DC
Start: 2020-07-12 — End: 2023-11-24

## 2020-07-12 MED ORDER — FLUTICASONE PROPIONATE 50 MCG/ACT NA SUSP
2.0000 | Freq: Every day | NASAL | 6 refills | Status: DC
Start: 2020-07-12 — End: 2023-11-24

## 2020-07-12 NOTE — Assessment & Plan Note (Signed)
Pt w/ hx of this.  Check labs and replete prn. 

## 2020-07-12 NOTE — Progress Notes (Signed)
   Subjective:    Patient ID: Kristina Nolan, female    DOB: [birth year], 64 y.o.   MRN: [id number]  HPI CPE- well overdue for mammo [facility]), colonoscopy (has cologuard at home to complete).  UTD on pap.  UTD on Tdap, due for COVID and flu.  Pt is not interested in COVID vaccines.  Reviewed past medical, surgical, family and social histories.   Health Maintenance  Topic Date Due  . COLONOSCOPY  Never done  . MAMMOGRAM  02/24/2017  . INFLUENZA VACCINE  05/06/2020  . COVID-19 Vaccine (1) 07/28/2020 (Originally 08/21/1968)  . HIV Screening  07/12/2021 (Originally [birth year]/1971)  . PAP SMEAR-Modifier  06/06/2021  . TETANUS/TDAP  06/16/2022  . Hepatitis C Screening  Completed    Patient Care Team    Relationship Specialty Notifications Start End  Sheliah Hatch, MD PCP - General Family Medicine  01/12/15   Ilda Mori, MD Consulting Physician Obstetrics and Gynecology  06/13/15   Benn Moulder, MD Referring Physician Internal Medicine  06/13/15       Review of Systems Patient reports no vision/ hearing changes, adenopathy,fever, weight change,  persistant/recurrent hoarseness , swallowing issues, chest pain, palpitations, edema, persistant/recurrent cough, hemoptysis, dyspnea (rest/exertional/paroxysmal nocturnal), gastrointestinal bleeding (melena, rectal bleeding), abdominal pain, significant heartburn, bowel changes, GU symptoms (dysuria, hematuria, incontinence), Gyn symptoms (abnormal  bleeding, pain),  syncope, focal weakness, memory loss, numbness & tingling, skin/hair/nail changes, abnormal bruising or bleeding, anxiety, or depression.   This visit occurred during the SARS-CoV-2 public health emergency.  Safety protocols were in place, including screening questions prior to the visit, additional usage of staff PPE, and extensive cleaning of exam room while observing appropriate contact time as indicated for disinfecting solutions.       Objective:   Physical  Exam        Assessment & Plan:

## 2020-07-12 NOTE — Patient Instructions (Addendum)
Follow up in 1 year or as needed We'll notify you of your lab results and make any changes if needed Continue to work on healthy diet and regular exercise- you can do it! Please check the date on the Cologuard and if still in-date, complete and return as directed.  If out of date, please message me so we can reorder it We will call you with your bone density and mammogram appts PLEASE get your COVID vaccines Call with any questions or concerns Stay Safe!  Stay Healthy!

## 2020-07-12 NOTE — Assessment & Plan Note (Signed)
Pt was started on Atorvastatin at last visit but she never started it.  Check labs and determine if different medication is needed

## 2020-07-12 NOTE — Assessment & Plan Note (Signed)
Pt's PE WNL.  Due for mammogram, DEXA (ordered).  UTD on pap.  Due for colon cancer screening- has cologuard at home and will check expiration date.  She is to let me know if it's expired and I can send another.  Flu shot given today.  Strongly encouraged COVID vaccines.  Check labs.  Anticipatory guidance provided.

## 2020-07-13 ENCOUNTER — Other Ambulatory Visit: Payer: Self-pay

## 2020-07-13 DIAGNOSIS — E785 Hyperlipidemia, unspecified: Secondary | ICD-10-CM

## 2020-07-13 MED ORDER — ATORVASTATIN CALCIUM 20 MG PO TABS
20.0000 mg | ORAL_TABLET | Freq: Every evening | ORAL | 1 refills | Status: DC
Start: 2020-07-13 — End: 2020-10-20

## 2020-07-13 NOTE — Progress Notes (Signed)
Called pt and lmovm to return call.

## 2020-08-16 DIAGNOSIS — Z6826 Body mass index (BMI) 26.0-26.9, adult: Secondary | ICD-10-CM | POA: Diagnosis not present

## 2020-08-16 DIAGNOSIS — Z01419 Encounter for gynecological examination (general) (routine) without abnormal findings: Secondary | ICD-10-CM | POA: Diagnosis not present

## 2020-08-24 ENCOUNTER — Other Ambulatory Visit: Payer: Self-pay

## 2020-08-24 ENCOUNTER — Ambulatory Visit (INDEPENDENT_AMBULATORY_CARE_PROVIDER_SITE_OTHER): Payer: BC Managed Care – PPO

## 2020-08-24 DIAGNOSIS — E785 Hyperlipidemia, unspecified: Secondary | ICD-10-CM

## 2020-08-24 LAB — HEPATIC FUNCTION PANEL
ALT: 32 U/L (ref 0–35)
AST: 42 U/L — ABNORMAL HIGH (ref 0–37)
Albumin: 4.4 g/dL (ref 3.5–5.2)
Alkaline Phosphatase: 109 U/L (ref 39–117)
Bilirubin, Direct: 0.1 mg/dL (ref 0.0–0.3)
Total Bilirubin: 0.5 mg/dL (ref 0.2–1.2)
Total Protein: 7.4 g/dL (ref 6.0–8.3)

## 2020-08-28 DIAGNOSIS — Z20822 Contact with and (suspected) exposure to covid-19: Secondary | ICD-10-CM | POA: Diagnosis not present

## 2020-09-05 ENCOUNTER — Ambulatory Visit
Admission: RE | Admit: 2020-09-05 | Discharge: 2020-09-05 | Disposition: A | Discharge status: Home / Self Care | Payer: BC Managed Care – PPO | Source: Ambulatory Visit | Attending: Family Medicine | Admitting: Family Medicine

## 2020-09-05 ENCOUNTER — Other Ambulatory Visit: Payer: Self-pay

## 2020-09-05 DIAGNOSIS — Z780 Asymptomatic menopausal state: Secondary | ICD-10-CM

## 2020-09-05 DIAGNOSIS — M8589 Other specified disorders of bone density and structure, multiple sites: Secondary | ICD-10-CM | POA: Diagnosis not present

## 2020-10-16 ENCOUNTER — Ambulatory Visit: Payer: BC Managed Care – PPO

## 2020-10-19 ENCOUNTER — Inpatient Hospital Stay (HOSPITAL_BASED_OUTPATIENT_CLINIC_OR_DEPARTMENT_OTHER)
Admission: EM | Admit: 2020-10-19 | Discharge: 2020-10-20 | DRG: 247 | Disposition: A | Discharge status: Home / Self Care | Payer: BC Managed Care – PPO | Attending: Internal Medicine | Admitting: Internal Medicine

## 2020-10-19 ENCOUNTER — Telehealth: Payer: BC Managed Care – PPO | Admitting: Physician Assistant

## 2020-10-19 ENCOUNTER — Other Ambulatory Visit: Payer: Self-pay

## 2020-10-19 ENCOUNTER — Encounter (HOSPITAL_COMMUNITY)
Admission: EM | Disposition: A | Discharge status: Home / Self Care | Payer: Self-pay | Source: Home / Self Care | Attending: Internal Medicine

## 2020-10-19 ENCOUNTER — Emergency Department (HOSPITAL_BASED_OUTPATIENT_CLINIC_OR_DEPARTMENT_OTHER): Payer: BC Managed Care – PPO

## 2020-10-19 ENCOUNTER — Encounter: Payer: Self-pay | Admitting: Physician Assistant

## 2020-10-19 DIAGNOSIS — G43909 Migraine, unspecified, not intractable, without status migrainosus: Secondary | ICD-10-CM | POA: Diagnosis not present

## 2020-10-19 DIAGNOSIS — I447 Left bundle-branch block, unspecified: Secondary | ICD-10-CM | POA: Diagnosis present

## 2020-10-19 DIAGNOSIS — Z8249 Family history of ischemic heart disease and other diseases of the circulatory system: Secondary | ICD-10-CM | POA: Diagnosis not present

## 2020-10-19 DIAGNOSIS — E7800 Pure hypercholesterolemia, unspecified: Secondary | ICD-10-CM | POA: Diagnosis present

## 2020-10-19 DIAGNOSIS — Z20822 Contact with and (suspected) exposure to covid-19: Secondary | ICD-10-CM | POA: Diagnosis present

## 2020-10-19 DIAGNOSIS — I2510 Atherosclerotic heart disease of native coronary artery without angina pectoris: Secondary | ICD-10-CM | POA: Diagnosis present

## 2020-10-19 DIAGNOSIS — I255 Ischemic cardiomyopathy: Secondary | ICD-10-CM | POA: Diagnosis not present

## 2020-10-19 DIAGNOSIS — I959 Hypotension, unspecified: Secondary | ICD-10-CM | POA: Diagnosis not present

## 2020-10-19 DIAGNOSIS — Z9851 Tubal ligation status: Secondary | ICD-10-CM | POA: Diagnosis not present

## 2020-10-19 DIAGNOSIS — I214 Non-ST elevation (NSTEMI) myocardial infarction: Secondary | ICD-10-CM | POA: Diagnosis not present

## 2020-10-19 DIAGNOSIS — E785 Hyperlipidemia, unspecified: Secondary | ICD-10-CM

## 2020-10-19 DIAGNOSIS — E0590 Thyrotoxicosis, unspecified without thyrotoxic crisis or storm: Secondary | ICD-10-CM

## 2020-10-19 DIAGNOSIS — R0789 Other chest pain: Secondary | ICD-10-CM | POA: Diagnosis not present

## 2020-10-19 DIAGNOSIS — Z79899 Other long term (current) drug therapy: Secondary | ICD-10-CM

## 2020-10-19 DIAGNOSIS — E0500 Thyrotoxicosis with diffuse goiter without thyrotoxic crisis or storm: Secondary | ICD-10-CM | POA: Diagnosis present

## 2020-10-19 DIAGNOSIS — Z9861 Coronary angioplasty status: Secondary | ICD-10-CM

## 2020-10-19 HISTORY — PX: LEFT HEART CATH AND CORONARY ANGIOGRAPHY: CATH118249

## 2020-10-19 HISTORY — DX: Hyperlipidemia, unspecified: E78.5

## 2020-10-19 HISTORY — DX: Non-ST elevation (NSTEMI) myocardial infarction: I21.4

## 2020-10-19 HISTORY — PX: CORONARY STENT INTERVENTION: CATH118234

## 2020-10-19 HISTORY — DX: Thyrotoxicosis with diffuse goiter without thyrotoxic crisis or storm: E05.00

## 2020-10-19 LAB — BASIC METABOLIC PANEL
Anion gap: 12 (ref 5–15)
BUN: 14 mg/dL (ref 8–23)
CO2: 24 mmol/L (ref 22–32)
Calcium: 10 mg/dL (ref 8.9–10.3)
Chloride: 103 mmol/L (ref 98–111)
Creatinine, Ser: 0.77 mg/dL (ref 0.44–1.00)
GFR, Estimated: >60 mL/min (ref >60)
Glucose, Bld: 144 mg/dL — ABNORMAL HIGH (ref 70–99)
Potassium: 4.4 mmol/L (ref 3.5–5.1)
Sodium: 139 mmol/L (ref 135–145)

## 2020-10-19 LAB — HEPATIC FUNCTION PANEL
ALT: 44 U/L (ref 0–44)
AST: 46 U/L — ABNORMAL HIGH (ref 15–41)
Albumin: 3.7 g/dL (ref 3.5–5.0)
Alkaline Phosphatase: 117 U/L (ref 38–126)
Bilirubin, Direct: <0.1 mg/dL (ref 0.0–0.2)
Total Bilirubin: 0.7 mg/dL (ref 0.3–1.2)
Total Protein: 7.1 g/dL (ref 6.5–8.1)

## 2020-10-19 LAB — RESP PANEL BY RT-PCR (FLU A&B, COVID) ARPGX2
Influenza A by PCR: NEGATIVE
Influenza B by PCR: NEGATIVE
SARS Coronavirus 2 by RT PCR: NEGATIVE

## 2020-10-19 LAB — POCT ACTIVATED CLOTTING TIME
Activated Clotting Time: 267 seconds
Activated Clotting Time: 285 seconds
Activated Clotting Time: 297 seconds

## 2020-10-19 LAB — APTT: aPTT: 23 seconds — ABNORMAL LOW (ref 24–36)

## 2020-10-19 LAB — LIPID PANEL
Cholesterol: 169 mg/dL (ref 0–200)
HDL: 63 mg/dL (ref >40)
LDL Cholesterol: 100 mg/dL — ABNORMAL HIGH (ref 0–99)
Total CHOL/HDL Ratio: 2.7 RATIO
Triglycerides: 31 mg/dL (ref <150)
VLDL: 6 mg/dL (ref 0–40)

## 2020-10-19 LAB — CBC
HCT: 45.8 % (ref 36.0–46.0)
Hemoglobin: 14.5 g/dL (ref 12.0–15.0)
MCH: 27.9 pg (ref 26.0–34.0)
MCHC: 31.7 g/dL (ref 30.0–36.0)
MCV: 88.1 fL (ref 80.0–100.0)
Platelets: 235 10*3/uL (ref 150–400)
RBC: 5.2 MIL/uL — ABNORMAL HIGH (ref 3.87–5.11)
RDW: 12.6 % (ref 11.5–15.5)
WBC: 7.7 10*3/uL (ref 4.0–10.5)
nRBC: 0 % (ref 0.0–0.2)

## 2020-10-19 LAB — TROPONIN I (HIGH SENSITIVITY)
Troponin I (High Sensitivity): 111 ng/L (ref <18)
Troponin I (High Sensitivity): 325 ng/L (ref <18)
Troponin I (High Sensitivity): 4492 ng/L (ref <18)

## 2020-10-19 LAB — T4, FREE: Free T4: 0.82 ng/dL (ref 0.61–1.12)

## 2020-10-19 LAB — PROTIME-INR
INR: 0.9 (ref 0.8–1.2)
Prothrombin Time: 11.9 seconds (ref 11.4–15.2)

## 2020-10-19 LAB — TSH: TSH: 1.071 u[IU]/mL (ref 0.350–4.500)

## 2020-10-19 SURGERY — LEFT HEART CATH AND CORONARY ANGIOGRAPHY
Anesthesia: LOCAL

## 2020-10-19 MED ORDER — IOHEXOL 350 MG/ML SOLN
INTRAVENOUS | Status: DC | PRN
  Administered 2020-10-19: 145 mL

## 2020-10-19 MED ORDER — MIDAZOLAM HCL 2 MG/2ML IJ SOLN
INTRAMUSCULAR | Status: AC
  Filled 2020-10-19: qty 2

## 2020-10-19 MED ORDER — ATORVASTATIN CALCIUM 80 MG PO TABS
80.0000 mg | ORAL_TABLET | Freq: Every evening | ORAL | Status: DC
  Administered 2020-10-19: 80 mg via ORAL
  Filled 2020-10-19: qty 1

## 2020-10-19 MED ORDER — HEPARIN SODIUM (PORCINE) 1000 UNIT/ML IJ SOLN
INTRAMUSCULAR | Status: DC | PRN
  Administered 2020-10-19: 3500 [IU] via INTRAVENOUS
  Administered 2020-10-19 (×2): 2000 [IU] via INTRAVENOUS
  Administered 2020-10-19: 3500 [IU] via INTRAVENOUS

## 2020-10-19 MED ORDER — HEPARIN (PORCINE) 25000 UT/250ML-% IV SOLN
850.0000 [IU]/h | INTRAVENOUS | Status: DC
  Administered 2020-10-19: 850 [IU]/h via INTRAVENOUS
  Filled 2020-10-19: qty 250

## 2020-10-19 MED ORDER — TICAGRELOR 90 MG PO TABS
90.0000 mg | ORAL_TABLET | Freq: Two times a day (BID) | ORAL | Status: DC
  Administered 2020-10-20: 90 mg via ORAL
  Filled 2020-10-19: qty 1

## 2020-10-19 MED ORDER — SODIUM CHLORIDE 0.9% FLUSH
3.0000 mL | Freq: Two times a day (BID) | INTRAVENOUS | Status: DC
  Administered 2020-10-20: 3 mL via INTRAVENOUS

## 2020-10-19 MED ORDER — HEPARIN (PORCINE) IN NACL 1000-0.9 UT/500ML-% IV SOLN
INTRAVENOUS | Status: DC | PRN
  Administered 2020-10-19 (×2): 500 mL

## 2020-10-19 MED ORDER — FENTANYL CITRATE (PF) 100 MCG/2ML IJ SOLN
INTRAMUSCULAR | Status: DC | PRN
  Administered 2020-10-19: 25 ug via INTRAVENOUS
  Administered 2020-10-19: 50 ug via INTRAVENOUS

## 2020-10-19 MED ORDER — ASPIRIN 81 MG PO CHEW
324.0000 mg | CHEWABLE_TABLET | Freq: Once | ORAL | Status: AC
  Administered 2020-10-19: 324 mg via ORAL
  Filled 2020-10-19: qty 4

## 2020-10-19 MED ORDER — ACETAMINOPHEN 325 MG PO TABS: 650.0000 mg | ORAL_TABLET | ORAL | Status: DC | PRN

## 2020-10-19 MED ORDER — MORPHINE SULFATE (PF) 2 MG/ML IV SOLN
2.0000 mg | Freq: Once | INTRAVENOUS | Status: AC
  Administered 2020-10-19: 2 mg via INTRAVENOUS
  Filled 2020-10-19: qty 1

## 2020-10-19 MED ORDER — HEPARIN (PORCINE) IN NACL 1000-0.9 UT/500ML-% IV SOLN
INTRAVENOUS | Status: AC
  Filled 2020-10-19: qty 1000

## 2020-10-19 MED ORDER — NITROGLYCERIN 1 MG/10 ML FOR IR/CATH LAB
INTRA_ARTERIAL | Status: AC
  Filled 2020-10-19: qty 10

## 2020-10-19 MED ORDER — ASPIRIN 81 MG PO CHEW
81.0000 mg | CHEWABLE_TABLET | Freq: Every day | ORAL | Status: DC
  Administered 2020-10-20: 81 mg via ORAL
  Filled 2020-10-19: qty 1

## 2020-10-19 MED ORDER — HEPARIN SODIUM (PORCINE) 5000 UNIT/ML IJ SOLN: 4000.0000 [IU] | Freq: Once | INTRAMUSCULAR | Status: DC

## 2020-10-19 MED ORDER — SODIUM CHLORIDE 0.9 % WEIGHT BASED INFUSION
1.0000 mL/kg/h | INTRAVENOUS | Status: DC
  Filled 2020-10-19: qty 1000

## 2020-10-19 MED ORDER — VERAPAMIL HCL 2.5 MG/ML IV SOLN
INTRAVENOUS | Status: AC
  Filled 2020-10-19: qty 2

## 2020-10-19 MED ORDER — HEPARIN SODIUM (PORCINE) 1000 UNIT/ML IJ SOLN
INTRAMUSCULAR | Status: AC
  Filled 2020-10-19: qty 1

## 2020-10-19 MED ORDER — SODIUM CHLORIDE 0.9 % IV SOLN
250.0000 mL | INTRAVENOUS | Status: DC | PRN
Start: 2020-10-19 — End: 2020-10-20

## 2020-10-19 MED ORDER — NITROGLYCERIN 0.4 MG SL SUBL
0.4000 mg | SUBLINGUAL_TABLET | SUBLINGUAL | Status: DC | PRN
  Administered 2020-10-19: 0.4 mg via SUBLINGUAL
  Filled 2020-10-19: qty 1

## 2020-10-19 MED ORDER — HYDRALAZINE HCL 20 MG/ML IJ SOLN
10.0000 mg | INTRAMUSCULAR | Status: AC | PRN
Start: 2020-10-19 — End: 2020-10-19

## 2020-10-19 MED ORDER — LORATADINE 10 MG PO TABS
10.0000 mg | ORAL_TABLET | Freq: Every day | ORAL | Status: DC
  Administered 2020-10-19: 10 mg via ORAL
  Filled 2020-10-19 (×2): qty 1

## 2020-10-19 MED ORDER — FLUTICASONE PROPIONATE 50 MCG/ACT NA SUSP
2.0000 | Freq: Every day | NASAL | Status: DC
  Filled 2020-10-19: qty 16

## 2020-10-19 MED ORDER — HEPARIN BOLUS VIA INFUSION
4000.0000 [IU] | Freq: Once | INTRAVENOUS | Status: AC
  Administered 2020-10-19: 4000 [IU] via INTRAVENOUS

## 2020-10-19 MED ORDER — NITROGLYCERIN 1 MG/10 ML FOR IR/CATH LAB
INTRA_ARTERIAL | Status: DC | PRN
  Administered 2020-10-19 (×3): 200 ug via INTRACORONARY

## 2020-10-19 MED ORDER — LABETALOL HCL 5 MG/ML IV SOLN: 10.0000 mg | INTRAVENOUS | Status: AC | PRN

## 2020-10-19 MED ORDER — HEPARIN (PORCINE) 25000 UT/250ML-% IV SOLN: 16.0000 [IU]/kg/h | INTRAVENOUS | Status: DC

## 2020-10-19 MED ORDER — LIDOCAINE HCL (PF) 1 % IJ SOLN
INTRAMUSCULAR | Status: AC
  Filled 2020-10-19: qty 30

## 2020-10-19 MED ORDER — MIDAZOLAM HCL 2 MG/2ML IJ SOLN
INTRAMUSCULAR | Status: DC | PRN
  Administered 2020-10-19 (×2): 1 mg via INTRAVENOUS

## 2020-10-19 MED ORDER — LIDOCAINE HCL (PF) 1 % IJ SOLN
INTRAMUSCULAR | Status: DC | PRN
  Administered 2020-10-19: 2 mL

## 2020-10-19 MED ORDER — FENTANYL CITRATE (PF) 100 MCG/2ML IJ SOLN
INTRAMUSCULAR | Status: AC
  Filled 2020-10-19: qty 2

## 2020-10-19 MED ORDER — SODIUM CHLORIDE 0.9 % WEIGHT BASED INFUSION
3.0000 mL/kg/h | INTRAVENOUS | Status: DC
  Filled 2020-10-19: qty 1000

## 2020-10-19 MED ORDER — SODIUM CHLORIDE 0.9 % IV SOLN: INTRAVENOUS | Status: AC

## 2020-10-19 MED ORDER — SODIUM CHLORIDE 0.9% FLUSH: 3.0000 mL | INTRAVENOUS | Status: DC | PRN

## 2020-10-19 MED ORDER — TICAGRELOR 90 MG PO TABS
ORAL_TABLET | ORAL | Status: DC | PRN
  Administered 2020-10-19: 180 mg via ORAL

## 2020-10-19 MED ORDER — VERAPAMIL HCL 2.5 MG/ML IV SOLN
INTRAVENOUS | Status: DC | PRN
  Administered 2020-10-19: 10 mL via INTRA_ARTERIAL

## 2020-10-19 MED ORDER — ATENOLOL 25 MG PO TABS
25.0000 mg | ORAL_TABLET | Freq: Two times a day (BID) | ORAL | Status: DC
  Administered 2020-10-19 – 2020-10-20 (×2): 25 mg via ORAL
  Filled 2020-10-19 (×2): qty 1

## 2020-10-19 MED ORDER — TICAGRELOR 90 MG PO TABS
ORAL_TABLET | ORAL | Status: AC
  Filled 2020-10-19: qty 2

## 2020-10-19 MED ORDER — ONDANSETRON HCL 4 MG/2ML IJ SOLN: 4.0000 mg | Freq: Four times a day (QID) | INTRAMUSCULAR | Status: DC | PRN

## 2020-10-19 MED ORDER — ENOXAPARIN SODIUM 40 MG/0.4ML ~~LOC~~ SOLN
40.0000 mg | SUBCUTANEOUS | Status: DC
  Administered 2020-10-20: 40 mg via SUBCUTANEOUS
  Filled 2020-10-19: qty 0.4

## 2020-10-19 SURGICAL SUPPLY — 21 items
BALLN EMERGE MR 2.0X12 (BALLOONS) ×2
BALLN SAPPHIRE ~~LOC~~ 2.5X8 (BALLOONS) ×1 IMPLANT
BALLN SAPPHIRE ~~LOC~~ 2.75X15 (BALLOONS) ×1 IMPLANT
BALLOON EMERGE MR 2.0X12 (BALLOONS) IMPLANT
CATH IMPULSE 5F ANG/FL3.5 (CATHETERS) ×1 IMPLANT
CATH LAUNCHER 6FR JR4 (CATHETERS) ×1 IMPLANT
DEVICE RAD COMP TR BAND LRG (VASCULAR PRODUCTS) ×1 IMPLANT
GLIDESHEATH SLEND SS 6F .021 (SHEATH) ×1 IMPLANT
GUIDEWIRE INQWIRE 1.5J.035X260 (WIRE) IMPLANT
INQWIRE 1.5J .035X260CM (WIRE) ×2
KIT ENCORE 26 ADVANTAGE (KITS) ×1 IMPLANT
KIT HEART LEFT (KITS) ×2 IMPLANT
PACK CARDIAC CATHETERIZATION (CUSTOM PROCEDURE TRAY) ×2 IMPLANT
SHEATH PROBE COVER 6X72 (BAG) ×1 IMPLANT
STENT RESOLUTE ONYX 2.25X12 (Permanent Stent) ×1 IMPLANT
STENT RESOLUTE ONYX 2.5X26 (Permanent Stent) ×1 IMPLANT
SYR MEDRAD MARK 7 150ML (SYRINGE) ×2 IMPLANT
TRANSDUCER W/STOPCOCK (MISCELLANEOUS) ×2 IMPLANT
TUBING CIL FLEX 10 FLL-RA (TUBING) ×2 IMPLANT
WIRE HI TORQ VERSACORE-J 145CM (WIRE) ×1 IMPLANT
WIRE RUNTHROUGH .014X180CM (WIRE) ×1 IMPLANT

## 2020-10-19 NOTE — ED Notes (Signed)
Pain resolved from Nitro with pressure drop to 127/75, after one Nitro. MD Made aware. Will continue to monitor.

## 2020-10-19 NOTE — Progress Notes (Signed)
[facility] Post-PCI Note  Date: 10/19/20 Time: 5:36 PM  Subjective: Patient complains of continued soreness in her chest, similar to pre-PCI to RCA this afternoon.  Pain is not like what she experienced this AM that bought her to the ED.  No shortness of breath or lightheadedness.  Objective: Temp:  [97.5 F (36.4 C)-98 F (36.7 C)] 98 F (36.7 C) (01/14 1400) Pulse Rate:  [67-115] 81 (01/14 1400) Resp:  [12-28] 22 (01/14 1400) BP: (116-163)/(49-91) 145/70 (01/14 1400) SpO2:  [93 %-100 %] 99 % (01/14 1443) Weight:  [71.2 kg] 71.2 kg (01/14 1027)  Gen: Appears tired but not in distress. Lungs: CTA bilaterally: Heart: RRR w/o m/r/g. Ext: Right radial TR band in place without hematoma.  No LE edema.  Post PCI EKG: NSR with LAD and LBBB.  Assessment/Plan: 65 y/o woman admitted with NSTEMI, found to have multivessel CAD, including 95% mid RCA stenosis, occluded small OM1 branch with faint collaterals, and 60% distal LCx disease.  She is s/p overlapping stents to the proximal/mid RCA and has continued to have chest soreness following the procedure.  Pain may be related to infarction and microvascular embolization.  She is hemodynamically stable.  She reportedly became quite hypotensive with SL NTG at [facility].  We will give morphine 2 mg IV x 1.  Patient to receive scheduled atenolol as well.  If pain persists, NTG paste versus NTG infusion may need to be considered, though hopefully pain will subside over the next few hours.  We will trend HS-TnI until peaked and obtain TTE in the AM.  Continue DAPT with ASA and ticagrelor.  Statin therapy also escalated to atorvastatin 80 mg daily.  Yvonne Kendall, MD [facility]

## 2020-10-19 NOTE — H&P (Addendum)
Cardiology Admission History and Physical:   Patient ID: Kristina Nolan; MRN: [id number]; DOB: [birth year]   Admission date: 10/19/2020  Primary Care Provider: Sheliah Hatch, MD Primary Cardiologist: No primary care provider on file. New Primary Electrophysiologist: None    Chief Complaint:  Chest pain  Patient Profile:   Kristina Nolan is a 65 y.o. female with a history of hyperthyroidism on methimazole (stopped), migraines, HLD, no hx HTN, DM  History of Present Illness:   Kristina Nolan went to [facility] with chest pain. Ez elevated, indicating NSTEMI. Pt transferred to [facility] for cath.   Kristina Nolan has never had chest pain in her life.   Her brother-in-law was electrocuted yesterday while working for the [employer]. She was riding in a car after she found out and had some mild chest pain that resolved without intervention.   Today, she had chest pain upon waking that did not resolve and that is the reason she went to the ER. The pain is an aching and reached a 5-6/10. It dropped to a 2/10 after SL NTG x 1, but her BP dropped as well, so no additional nitro was given.   Currently the pain is a 1-2/10. She received ASA 324 mg as well as the nitro and was put on a heparin drip. No other meds given.  She has some SOB with the chest pain, but no N&V or diaphoresis.    Past Medical History:  Diagnosis Date  . Graves' disease   . Hyperlipidemia   . Hyperthyroidism   . Migraines   . NSTEMI (non-ST elevated myocardial infarction) (HCC) 10/19/2020    Past Surgical History:  Procedure Laterality Date  . BREAST ENHANCEMENT SURGERY    . TUBAL LIGATION       Medications Prior to Admission: Prior to Admission medications   Medication Sig Start Date Kristina Nolan Date Taking? Authorizing Provider  atenolol (TENORMIN) 25 MG tablet Take 25 mg by mouth daily.   Yes [provider]  atorvastatin (LIPITOR) 20 MG tablet Take 1 tablet (20 mg total) by mouth at  bedtime. 07/13/20  Yes Sheliah Hatch, MD  cetirizine (ZYRTEC) 10 MG tablet Take 1 tablet (10 mg total) by mouth daily. 07/12/20   Sheliah Hatch, MD  fluticasone (FLONASE) 50 MCG/ACT nasal spray Place 2 sprays into both nostrils daily. 07/12/20   Sheliah Hatch, MD  methimazole (TAPAZOLE) 5 MG tablet Take 5 mg by mouth daily. Patient not taking: No sig reported    [provider]  valACYclovir (VALTREX) 500 MG tablet TAKE 1 TABLET(S) EVERY DAY BY ORAL ROUTE. 02/27/15   [provider]     Allergies:   No Known Allergies  Social History:   Social History   Socioeconomic History  . Marital status: Married    Spouse name: Not on file  . Number of children: Not on file  . Years of education: Not on file  . Highest education level: Not on file  Occupational History  . Not on file  Tobacco Use  . Smoking status: Never Smoker  . Smokeless tobacco: Never Used  Vaping Use  . Vaping Use: Never used  Substance and Sexual Activity  . Alcohol use: No  . Drug use: No  . Sexual activity: Not on file  Other Topics Concern  . Not on file  Social History Narrative  . Not on file   Social Determinants of Health   Financial Resource Strain: Not on file  Food  Insecurity: Not on file  Transportation Needs: Not on file  Physical Activity: Not on file  Stress: Not on file  Social Connections: Not on file  Intimate Partner Violence: Not on file    Family History:  The patient's family history includes Cancer in her father and mother; Heart attack in her brother and father; Thyroid disease in her cousin.   The patient She indicated that her mother is deceased. She indicated that her father is deceased. She indicated that her brother is deceased. She indicated that her maternal grandmother is deceased. She indicated that her maternal grandfather is deceased. She indicated that her paternal grandmother is deceased. She indicated that her paternal grandfather is  deceased. She indicated that the status of her cousin is unknown.  ROS:  Please see the history of present illness.  All other ROS reviewed and negative.     Physical Exam/Data:   Vitals:   10/19/20 1316 10/19/20 1317 10/19/20 1330 10/19/20 1400  BP:   (!) 142/72 (!) 145/70  Pulse: 70 71 79 81  Resp: 16 14 (!) 21 (!) 22  Temp:    98 F (36.7 C)  TempSrc:    Oral  SpO2: 98% 99% 100% 98%  Weight:      Height:       No intake or output data in the 24 hours ending 10/19/20 1424 Filed Weights   10/19/20 1027  Weight: 71.2 kg   Body mass index is 26.13 kg/m.  General:  Well nourished, well developed, female in mild acute distress HEENT: normal Lymph: no adenopathy Neck:  JVD not elevated Endocrine:  No thryomegaly Vascular: No carotid bruits; 4/4 extremity pulses 2+ bilaterally  Cardiac:  normal S1, S2; RRR; no murmur, no rub or gallop  Lungs:  clear to auscultation bilaterally, no wheezing, rhonchi or rales  Abd: soft, nontender, no hepatomegaly  Ext: no edema Musculoskeletal:  No deformities, BUE and BLE strength normal and equal Skin: warm and dry  Neuro:  CNs 2-12 intact, no focal abnormalities noted Psych:  Normal affect    EKG:  The ECG was personally reviewed: SR, HR 85, LBBB is new from 2016 Telemetry: SR  Relevant CV Studies:  CATH: scheduled  Laboratory Data:  Chemistry Recent Labs  Lab 10/19/20 1045  NA 139  K 4.4  CL 103  CO2 24  GLUCOSE 144*  BUN 14  CREATININE 0.77  CALCIUM 10.0  GFRNONAA >60  ANIONGAP 12    No results for input(s): PROT, ALBUMIN, AST, ALT, ALKPHOS, BILITOT in the last 168 hours. Hematology Recent Labs  Lab 10/19/20 1045  WBC 7.7  RBC 5.20*  HGB 14.5  HCT 45.8  MCV 88.1  MCH 27.9  MCHC 31.7  RDW 12.6  PLT 235   Cardiac Enzymes  High Sensitivity Troponin:   Recent Labs  Lab 10/19/20 1045 10/19/20 1218  TROPONINIHS 111* 325*     BNPNo results for input(s): BNP, PROBNP in the last 168 hours.   Lipids:   Lab Results  Component Value Date   CHOL 255 (H) 07/12/2020   HDL 66.50 07/12/2020   LDLCALC 170 (H) 07/12/2020   TRIG 91.0 07/12/2020   CHOLHDL 4 07/12/2020   INR:  Lab Results  Component Value Date   INR 0.9 10/19/2020   A1c: No results found for: HGBA1C Thyroid:  Lab Results  Component Value Date   TSH 1.30 07/12/2020   T3TOTAL 361.6 (H) 01/12/2015   T4TOTAL 15.7 (H) 01/12/2015    Radiology/Studies:  DG Chest 1 View  Result Date: 10/19/2020 CLINICAL DATA:  Onset chest tightness yesterday. EXAM: CHEST  1 VIEW COMPARISON:  None. FINDINGS: Lungs clear. Heart size normal. No pneumothorax or pleural fluid. No bony abnormality. IMPRESSION: Negative chest. Electronically Signed   By: Drusilla Kanner M.D.   On: 10/19/2020 12:54    Assessment and Plan:   1. NSTEMI - pt transferred from [facility] for cath - further evaluation and treatment based on results - screen for CRFs and their control  2. HLD - last cholesterol profile is above - hx abnl LFTs 2nd methimazole - ck LFTs and repeat lipid profile, then decide on rx  3. Hyperthyroidism/Grave's dz - ck full TFTs, may need Endocrinology to see  Principal Problem:   NSTEMI (non-ST elevated myocardial infarction) [facility]) Active Problems:   Hyperthyroidism   Hyperlipidemia     For questions or updates, please contact [facility] Please consult [email or web address] for contact info under Cardiology/STEMI.    Signed, Kristina Demark, PA-C  10/19/2020 2:24 PM    I have independently seen and examined the patient and agree with the findings and plan, as documented in Kristina Nolan's note, with the following additions/changes.  Kristina. Gesell is a 65 year old woman with history of hypothyroidism, hyperlipidemia, and migraine headaches, who has been transferred from [facility] for evaluation of chest pain and elevated troponins concerning for NSTEMI.  She was in her usual state of health until yesterday afternoon when she was  informed of the death of her brother-in-law.  She subsequently developed pressure-like pain across her chest that ultimately resolved on its own.  However, when she awoke this morning the pain had recurred and ultimately prompted her to go to med [employer].  She received sublingual nitroglycerin x1 with improvement but not resolution of the pain.  She continues to complain of vague discomfort across her chest.  She has not had any shortness of breath, palpitations, or lightheadedness.  She denies previous chest pain as well as known cardiac disease.  Physical exam is notable for regular rate and rhythm without murmurs.  Lungs are clear bilaterally.  Abdomen is soft and nontender.  There is no lower extremity edema.  EKG, personally reviewed, shows normal sinus rhythm with left bundle branch block, which is new since 2016.  Labs are notable for high-sensitivity troponin I of 111 -> 325, sodium 139, potassium 4.4, CO2 24, glucose 144, BUN 14, creatinine 0.8, white blood cell count 7.7, hemoglobin 14.5, platelets 235, and COVID-19 negative.  Chest radiograph did not show any acute abnormality.  In summary, Kristina. Scheel is 65 year old woman with no prior cardiac history presenting with acute onset of chest pain and new left bundle branch block as well as mild elevation of high-sensitivity troponins in the setting of unexpected death of her brother-in-law.  Certainly her history is concerning for stress-induced cardiomyopathy.  However, underlying CAD leading to NSTEMI must be excluded.  I have recommended proceeding with cardiac catheterization with possible PCI, which Kristina. Dedrick is in agreement with.  I have reviewed the risks, indications, and alternatives to cardiac catheterization, possible angioplasty, and stenting with the patient. Risks include but are not limited to bleeding, infection, vascular injury, stroke, myocardial infection, arrhythmia, kidney injury, radiation-related injury in the case of  prolonged fluoroscopy use, emergency cardiac surgery, and death. The patient understands the risks of serious complication is 1-2 in 1000 with diagnostic cardiac cath and 1-2% or less with angioplasty/stenting.  We will await results of lipid  panel and LFTs to determine ongoing statin therapy as well as results of catheterization.  Yvonne Kendall, MD [facility]

## 2020-10-19 NOTE — Progress Notes (Signed)
ANTICOAGULATION CONSULT NOTE - Initial Consult  Pharmacy Consult for Heparin Indication: chest pain/ACS  No Known Allergies  Patient Measurements: Height: 5\' 5"  (165.1 cm) Weight: 71.2 kg (157 lb) IBW/kg (Calculated) : 57 Heparin Dosing Weight: 71.2 kg  Vital Signs: Temp: 97.5 F (36.4 C) (01/14 1031) Temp Source: Oral (01/14 1031) BP: 117/49 (01/14 1251) Pulse Rate: 71 (01/14 1317)  Labs: Recent Labs    10/19/20 1045 10/19/20 1218  HGB 14.5  --   HCT 45.8  --   PLT 235  --   CREATININE 0.77  --   TROPONINIHS 111* 325*    Estimated Creatinine Clearance: 70.3 mL/min (by C-G formula based on SCr of 0.77 mg/dL).   Medical History: Past Medical History:  Diagnosis Date  . Hyperthyroidism   . Migraines     Medications:  Scheduled:  . heparin  4,000 Units Intravenous Once    Assessment: CP and NSTEMI Goal of Therapy:  Heparin level 0.3-0.7 units/ml Monitor platelets by anticoagulation protocol: Yes   Plan:  Give 4000 units bolus x 1 Start heparin infusion at 850 units/hr Check anti-Xa level in 6 hours and daily while on heparin Continue to monitor H&H and platelets  10/21/20 A Aven Cegielski 10/19/2020,1:21 PM

## 2020-10-19 NOTE — ED Notes (Signed)
MD aware of trop 325

## 2020-10-19 NOTE — Progress Notes (Signed)
[facility] to [facility] transfer:  Patient with h/o hyperthyroidism and HLD presenting to [facility] with CP, found to have NSTEMI.  Since this patient has limited medical problems and appears to be appropriate for cardiology admission, I have requested that the EDP contact them to request admission - the [facility] team is currently inundated with patients.  If additional TRH assistance is needed, we will be happy to assist.   Georgana Curio, M.D.

## 2020-10-19 NOTE — ED Provider Notes (Signed)
[facility] EMERGENCY DEPARTMENT Provider Note   CSN: [id number] Arrival date & time: 10/19/20  1018     History Chief Complaint  Patient presents with  . Chest Pain    Kristina Nolan is a 65 y.o. female.  Patient presents ER chief complaint of chest pain he describes as aching persistent pain in the mid chest.  Initially started yesterday as a short duration pain.  She had found out that her brother-in-law just passed away and they were in the car driving when she felt mid chest pain for 3 to 4 minutes that resolved.  Then this morning when she woke up she had persistent mid chest pain that has lasted several hours now does not radiate anywhere else, no associated shortness of breath or palpitations or diaphoresis.  No exertion seems to make it better or worse.  Patient otherwise denies cardiac history.  She has a history of high cholesterol, her last EKG was about 6 years ago per her recollection it was normal.  Family history of a brother who died of a cardiac infarct at age 91s.        Past Medical History:  Diagnosis Date  . Hyperthyroidism   . Migraines     Patient Active Problem List   Diagnosis Date Noted  . Vitamin D deficiency 07/12/2020  . Hyperlipidemia 07/08/2018  . Graves' disease 08/08/2015  . Physical exam 06/13/2015  . Hyperthyroidism 02/05/2015  . SOB (shortness of breath) on exertion 01/12/2015    Past Surgical History:  Procedure Laterality Date  . BREAST ENHANCEMENT SURGERY    . TUBAL LIGATION       OB History   No obstetric history on file.     Family History  Problem Relation Age of Onset  . Cancer Mother        lung  . Cancer Father        adrenal  . Heart attack Father   . Heart attack Brother   . Thyroid disease Cousin     Social History   Tobacco Use  . Smoking status: Never Smoker  . Smokeless tobacco: Never Used  Vaping Use  . Vaping Use: Never used  Substance Use Topics  . Alcohol use: No  . Drug use:  No    Home Medications Prior to Admission medications   Medication Sig Start Date End Date Taking? Authorizing Provider  atenolol (TENORMIN) 25 MG tablet Take 25 mg by mouth daily.   Yes [provider]  atorvastatin (LIPITOR) 20 MG tablet Take 1 tablet (20 mg total) by mouth at bedtime. 07/13/20  Yes Sheliah Hatch, MD  cetirizine (ZYRTEC) 10 MG tablet Take 1 tablet (10 mg total) by mouth daily. 07/12/20   Sheliah Hatch, MD  fluticasone (FLONASE) 50 MCG/ACT nasal spray Place 2 sprays into both nostrils daily. 07/12/20   Sheliah Hatch, MD  methimazole (TAPAZOLE) 5 MG tablet Take 5 mg by mouth daily. Patient not taking: No sig reported    [provider]  valACYclovir (VALTREX) 500 MG tablet TAKE 1 TABLET(S) EVERY DAY BY ORAL ROUTE. 02/27/15   [provider]    Allergies    Patient has no known allergies.  Review of Systems   Review of Systems  Constitutional: Negative for fever.  HENT: Negative for ear pain.   Eyes: Negative for pain.  Respiratory: Negative for cough.   Cardiovascular: Positive for chest pain.  Gastrointestinal: Negative for abdominal pain.  Genitourinary: Negative for flank  pain.  Musculoskeletal: Negative for back pain.  Skin: Negative for rash.  Neurological: Negative for headaches.    Physical Exam Updated Vital Signs BP (!) 117/49   Pulse 71   Temp (!) 97.5 F (36.4 C) (Oral)   Resp 14   Ht 5\' 5"  (1.651 m)   Wt 71.2 kg   SpO2 99%   BMI 26.13 kg/m   Physical Exam Constitutional:      General: She is not in acute distress.    Appearance: Normal appearance.  HENT:     Head: Normocephalic.     Nose: Nose normal.  Eyes:     Extraocular Movements: Extraocular movements intact.  Cardiovascular:     Rate and Rhythm: Normal rate.  Pulmonary:     Effort: Pulmonary effort is normal.  Musculoskeletal:        General: Normal range of motion.     Cervical back: Normal range of motion.  Neurological:      General: No focal deficit present.     Mental Status: She is alert. Mental status is at baseline.     ED Results / Procedures / Treatments   Labs (all labs ordered are listed, but only abnormal results are displayed) Labs Reviewed  BASIC METABOLIC PANEL - Abnormal; Notable for the following components:      Result Value   Glucose, Bld 144 (*)    All other components within normal limits  CBC - Abnormal; Notable for the following components:   RBC 5.20 (*)    All other components within normal limits  TROPONIN I (HIGH SENSITIVITY) - Abnormal; Notable for the following components:   Troponin I (High Sensitivity) 111 (*)    All other components within normal limits  TROPONIN I (HIGH SENSITIVITY) - Abnormal; Notable for the following components:   Troponin I (High Sensitivity) 325 (*)    All other components within normal limits  PROTIME-INR  HEPARIN LEVEL (UNFRACTIONATED)  APTT    EKG EKG Interpretation  Date/Time:  Friday October 19 2020 10:24:32 EST Ventricular Rate:  85 PR Interval:  154 QRS Duration: 148 QT Interval:  424 QTC Calculation: 504 R Axis:   14 Text Interpretation: Normal sinus rhythm Left bundle branch block Abnormal ECG Confirmed by [id number] ([id number]) on 10/19/2020 12:01:35 PM   Radiology DG Chest 1 View  Result Date: 10/19/2020 CLINICAL DATA:  Onset chest tightness yesterday. EXAM: CHEST  1 VIEW COMPARISON:  None. FINDINGS: Lungs clear. Heart size normal. No pneumothorax or pleural fluid. No bony abnormality. IMPRESSION: Negative chest. Electronically Signed   By: 10/21/2020 M.D.   On: 10/19/2020 12:54    Procedures Procedures (including critical care time)  Medications Ordered in ED Medications  nitroGLYCERIN (NITROSTAT) SL tablet 0.4 mg (0.4 mg Sublingual Given 10/19/20 1233)  heparin ADULT infusion 100 units/mL (25000 units/241mL) (has no administration in time range)  heparin bolus via infusion 4,000 Units (has no administration in time  range)  aspirin chewable tablet 324 mg (324 mg Oral Given 10/19/20 1212)    ED Course  I have reviewed the triage vital signs and the nursing notes.  Pertinent labs & imaging results that were available during my care of the patient were reviewed by me and considered in my medical decision making (see chart for details).    MDM Rules/Calculators/A&P                          EKG shows sinus rhythm, left  and the branch block pattern, scar Bosa's criteria is negative however.  Vital signs otherwise stable.  Initial troponin is elevated at 100 and 11 repeat troponin pending.  Patient given aspirin nitroglycerin, subsequently states that her chest pain is gone now.  Case discussed with cardiologist for admission for acute coronary syndrome.   Final Clinical Impression(s) / ED Diagnoses Final diagnoses:  NSTEMI (non-ST elevation myocardial infarction) [facility])    Rx / DC Orders ED Discharge Orders    None       Cheryll Cockayne, MD 10/19/20 1326

## 2020-10-19 NOTE — Interval H&P Note (Signed)
History and Physical Interval Note:  10/19/2020 3:08 PM  Kristina Nolan  has presented today for surgery, with the diagnosis of NSTEMI.  The various methods of treatment have been discussed with the patient and family. After consideration of risks, benefits and other options for treatment, the patient has consented to  Procedure(s): LEFT HEART CATH AND CORONARY ANGIOGRAPHY (N/A) as a surgical intervention.  The patient's history has been reviewed, patient examined, no change in status, stable for surgery.  I have reviewed the patient's chart and labs.  Questions were answered to the patient's satisfaction.    Cath Lab Visit (complete for each Cath Lab visit)  Clinical Evaluation Leading to the Procedure:   ACS: Yes.    Non-ACS:  N/A  Kristina Nolan

## 2020-10-19 NOTE — ED Triage Notes (Signed)
Pt reports tightness in the center of her chest that was intermittent yesterday and constant today. Pt denies shortness of breath, n/v. Pt states she has had a few very stressful past 2 days.

## 2020-10-20 ENCOUNTER — Inpatient Hospital Stay (HOSPITAL_COMMUNITY): Payer: BC Managed Care – PPO

## 2020-10-20 DIAGNOSIS — I214 Non-ST elevation (NSTEMI) myocardial infarction: Secondary | ICD-10-CM

## 2020-10-20 DIAGNOSIS — Z9861 Coronary angioplasty status: Secondary | ICD-10-CM

## 2020-10-20 DIAGNOSIS — I255 Ischemic cardiomyopathy: Secondary | ICD-10-CM

## 2020-10-20 DIAGNOSIS — I2510 Atherosclerotic heart disease of native coronary artery without angina pectoris: Secondary | ICD-10-CM

## 2020-10-20 LAB — ECHOCARDIOGRAM COMPLETE
Area-P 1/2: 3.99 cm2
Height: 65 in
S' Lateral: 2.6 cm
Weight: 2512 oz

## 2020-10-20 LAB — TROPONIN I (HIGH SENSITIVITY)
Troponin I (High Sensitivity): 10582 ng/L (ref <18)
Troponin I (High Sensitivity): 21634 ng/L (ref <18)

## 2020-10-20 LAB — BASIC METABOLIC PANEL
Anion gap: 11 (ref 5–15)
BUN: 9 mg/dL (ref 8–23)
CO2: 24 mmol/L (ref 22–32)
Calcium: 9.5 mg/dL (ref 8.9–10.3)
Chloride: 103 mmol/L (ref 98–111)
Creatinine, Ser: 0.75 mg/dL (ref 0.44–1.00)
GFR, Estimated: >60 mL/min (ref >60)
Glucose, Bld: 133 mg/dL — ABNORMAL HIGH (ref 70–99)
Potassium: 4.4 mmol/L (ref 3.5–5.1)
Sodium: 138 mmol/L (ref 135–145)

## 2020-10-20 LAB — CBC
HCT: 37 % (ref 36.0–46.0)
Hemoglobin: 12.6 g/dL (ref 12.0–15.0)
MCH: 28.4 pg (ref 26.0–34.0)
MCHC: 34.1 g/dL (ref 30.0–36.0)
MCV: 83.3 fL (ref 80.0–100.0)
Platelets: 208 10*3/uL (ref 150–400)
RBC: 4.44 MIL/uL (ref 3.87–5.11)
RDW: 12.6 % (ref 11.5–15.5)
WBC: 10 10*3/uL (ref 4.0–10.5)
nRBC: 0 % (ref 0.0–0.2)

## 2020-10-20 LAB — THYROTROPIN RECEPTOR AUTOABS: Thyrotropin Receptor Ab: <1.1 IU/L (ref 0.00–1.75)

## 2020-10-20 LAB — T3, FREE: T3, Free: 2.9 pg/mL (ref 2.0–4.4)

## 2020-10-20 MED ORDER — LOSARTAN POTASSIUM 25 MG PO TABS
12.5000 mg | ORAL_TABLET | Freq: Every day | ORAL | Status: DC
  Administered 2020-10-20: 12.5 mg via ORAL
  Filled 2020-10-20: qty 1

## 2020-10-20 MED ORDER — METOPROLOL SUCCINATE ER 50 MG PO TB24: 50.0000 mg | ORAL_TABLET | Freq: Every day | ORAL | 3 refills | Status: DC

## 2020-10-20 MED ORDER — NITROGLYCERIN 0.4 MG SL SUBL: 0.4000 mg | SUBLINGUAL_TABLET | SUBLINGUAL | 0 refills | Status: DC | PRN

## 2020-10-20 MED ORDER — TICAGRELOR 90 MG PO TABS: 90.0000 mg | ORAL_TABLET | Freq: Two times a day (BID) | ORAL | 3 refills | Status: DC

## 2020-10-20 MED ORDER — LOSARTAN POTASSIUM 25 MG PO TABS: 12.5000 mg | ORAL_TABLET | Freq: Every day | ORAL | 3 refills | Status: DC

## 2020-10-20 MED ORDER — ASPIRIN 81 MG PO CHEW: 81.0000 mg | CHEWABLE_TABLET | Freq: Every day | ORAL | 3 refills | Status: DC

## 2020-10-20 MED ORDER — ATORVASTATIN CALCIUM 80 MG PO TABS: 80.0000 mg | ORAL_TABLET | Freq: Every evening | ORAL | 3 refills | Status: DC

## 2020-10-20 MED ORDER — METOPROLOL SUCCINATE ER 50 MG PO TB24: 50.0000 mg | ORAL_TABLET | Freq: Every day | ORAL | Status: DC

## 2020-10-20 NOTE — Progress Notes (Signed)
CARDIAC REHAB PHASE I   PRE:  Rate/Rhythm: 73 SR  BP:  Supine: 129/68 Sitting:   Standing:    SaO2: 98% RA  MODE:  Ambulation: 412 ft   POST:  Rate/Rhythm: 97  BP:  Supine:   Sitting: 140/99  Standing:    SaO2: 98% RA  10/20/20 2703-5009 Patient tolerated ambulation well without symptoms. BP elevated after ambulation 140/99 recheck after education was 137/81. MI/stent education complete including Brilinta use, CP, NTG use, and calling 911, restrictions, risk factor modification and activity progression. MI book, heart healthy diet and exercise guidelines hand outs given. Discussed phase 2 cardiac rehab and will refer to program at [facility]. Patient verbalizes understanding of instructions given.   Artist Pais, MS, ACSM CEP

## 2020-10-20 NOTE — Progress Notes (Signed)
Progress Note  Patient Name: Kristina Nolan Date of Encounter: 10/20/2020  [facility] Cardiologist: No primary care provider on file.   Subjective   Currently feeling well.  No chest pain or shortness of breath.  Inpatient Medications    Scheduled Meds: . aspirin  81 mg Oral Daily  . atenolol  25 mg Oral BID  . atorvastatin  80 mg Oral QPM  . enoxaparin (LOVENOX) injection  40 mg Subcutaneous Q24H  . fluticasone  2 spray Each Nare Daily  . loratadine  10 mg Oral Daily  . sodium chloride flush  3 mL Intravenous Q12H  . ticagrelor  90 mg Oral BID   Continuous Infusions: . sodium chloride     PRN Meds: sodium chloride, acetaminophen, nitroGLYCERIN, ondansetron (ZOFRAN) IV, sodium chloride flush   Vital Signs    Vitals:   10/19/20 1857 10/19/20 2043 10/20/20 0500 10/20/20 0835  BP: 140/77 137/81 127/80 137/81  Pulse: 91 98 72 74  Resp:  18 18   Temp:  99.1 F (37.3 C) 98.6 F (37 C)   TempSrc:  Oral Oral   SpO2: 99% 96% 98%   Weight:      Height:       No intake or output data in the 24 hours ending 10/20/20 0956 Last 3 Weights 10/19/2020 07/12/2020 02/04/2019  Weight (lbs) 157 lb 153 lb 6 oz 150 lb  Weight (kg) 71.215 kg 69.57 kg 68.04 kg      Telemetry    Sinus rhythm- Personally Reviewed  ECG    Sinus rhythm- Personally Reviewed  Physical Exam   GEN: No acute distress.   Neck: No JVD Cardiac: RRR, no murmurs, rubs, or gallops.  Respiratory: Clear to auscultation bilaterally. GI: Soft, nontender, non-distended  MS: No edema; No deformity. Neuro:  Nonfocal  Psych: Normal affect   Labs    High Sensitivity Troponin:   Recent Labs  Lab 10/19/20 1045 10/19/20 1218 10/19/20 1752 10/19/20 2342  TROPONINIHS 111* 325* 4,492* 10,582*      Chemistry Recent Labs  Lab 10/19/20 1045 10/19/20 1525  NA 139  --   K 4.4  --   CL 103  --   CO2 24  --   GLUCOSE 144*  --   BUN 14  --   CREATININE 0.77  --   CALCIUM 10.0  --   PROT  --  7.1   ALBUMIN  --  3.7  AST  --  46*  ALT  --  44  ALKPHOS  --  117  BILITOT  --  0.7  GFRNONAA >60  --   ANIONGAP 12  --      Hematology Recent Labs  Lab 10/19/20 1045 10/19/20 2342  WBC 7.7 10.0  RBC 5.20* 4.44  HGB 14.5 12.6  HCT 45.8 37.0  MCV 88.1 83.3  MCH 27.9 28.4  MCHC 31.7 34.1  RDW 12.6 12.6  PLT 235 208    BNPNo results for input(s): BNP, PROBNP in the last 168 hours.   DDimer No results for input(s): DDIMER in the last 168 hours.   Radiology    DG Chest 1 View  Result Date: 10/19/2020 CLINICAL DATA:  Onset chest tightness yesterday. EXAM: CHEST  1 VIEW COMPARISON:  None. FINDINGS: Lungs clear. Heart size normal. No pneumothorax or pleural fluid. No bony abnormality. IMPRESSION: Negative chest. Electronically Signed   By: Drusilla Kanner M.D.   On: 10/19/2020 12:54   CARDIAC CATHETERIZATION  Result Date: 10/19/2020  Conclusions: 1. Severe two-vessel coronary artery disease, including occlusion of small OM1 with left-to-left and right-to-left collaterals suggesting subacute/chronic nature, 60% distal LCx stenosis involving ostium of OM3, and sequential 50% proximal, 95% mid, and 50% mid RCA lesions.  Mild LAD plaquing with heavy mural calcification is also present. 2. Low normal left ventricular systolic function with mildly elevated filling pressure. 3. Successful PCI to 50% proximal and 95% mid RCA lesions with overlapping Resolute Onyx 2.5 x 26 mm and 2.25 x 12 mm drug-eluting stents with 0% residual stenosis and TIMI-3 flow. Recommendations: 1. Dual antiplatelet therapy with aspirin and ticagrelor for at least 12 months. 2. Aggressive secondary prevention of coronary artery disease. 3. Favor medical therapy of small occluded OM1 branch and distal LCx. Yvonne Kendall, MD [facility]    Cardiac Studies   [facility] 10/20/20 1. Severe two-vessel coronary artery disease, including occlusion of small OM1 with left-to-left and right-to-left collaterals suggesting  subacute/chronic nature, 60% distal LCx stenosis involving ostium of OM3, and sequential 50% proximal, 95% mid, and 50% mid RCA lesions.  Mild LAD plaquing with heavy mural calcification is also present. 2. Low normal left ventricular systolic function with mildly elevated filling pressure. 3. Successful PCI to 50% proximal and 95% mid RCA lesions with overlapping Resolute Onyx 2.5 x 26 mm and 2.25 x 12 mm drug-eluting stents with 0% residual stenosis and TIMI-3 flow.   Patient Profile     65 y.o. female with a history of hypothyroidism, hyperlipidemia who presented to the hospital with chest pain, found to have a non-STEMI.  Assessment & Plan    1.  Non-STEMI: Status post PCI to the RCA.  Zubin Pontillo need dual antiplatelet therapy for at least 1 year with aspirin and ticagrelor.  We Javed Cotto also add Toprol-XL 50 mg daily.  She Aleasha Fregeau need blood pressure checks at home to ensure that this is well controlled.  2.  Hyperlipidemia:LDL above goal at 100.  Has a history of elevated LFTs, though not elevated today.  We Vinisha Faxon continue 80 mg of atorvastatin.  If LFTs become elevated again, would potentially consider Repatha.  3.  Hyperthyroidism/Graves' disease: TSH normal.  No changes.  TTE is currently pending.  If her ejection fraction remains stable, would likely plan for discharge home today.  For questions or updates, please contact [facility] Please consult [email or web address] for contact info under        Signed, Anh Mangano Jorja Loa, MD  10/20/2020, 9:56 AM

## 2020-10-20 NOTE — Discharge Summary (Addendum)
Discharge Summary    Patient ID: Kristina Nolan MRN: [id number]; DOB: December 28, 1955  Admit date: 10/19/2020 Discharge date: 10/20/2020  Primary Care Provider: Sheliah Hatch, MD  Primary Cardiologist: Dr. Yvonne Kendall, MD   Discharge Diagnoses    Principal Problem:   NSTEMI (non-ST elevated myocardial infarction) [facility]) Active Problems:   Hyperthyroidism   Hyperlipidemia   CAD S/P percutaneous coronary angioplasty   Ischemic cardiomyopathy  Diagnostic Studies/Procedures    LHC 10/19/20:  Conclusions: 1. Severe two-vessel coronary artery disease, including occlusion of small OM1 with left-to-left and right-to-left collaterals suggesting subacute/chronic nature, 60% distal LCx stenosis involving ostium of OM3, and sequential 50% proximal, 95% mid, and 50% mid RCA lesions.  Mild LAD plaquing with heavy mural calcification is also present. 2. Low normal left ventricular systolic function with mildly elevated filling pressure. 3. Successful PCI to 50% proximal and 95% mid RCA lesions with overlapping Resolute Onyx 2.5 x 26 mm and 2.25 x 12 mm drug-eluting stents with 0% residual stenosis and TIMI-3 flow.  Recommendations: 1. Dual antiplatelet therapy with aspirin and ticagrelor for at least 12 months. 2. Aggressive secondary prevention of coronary artery disease. 3. Favor medical therapy of small occluded OM1 branch and distal LCx.  Diagnostic Dominance: Right    Intervention     _____________    Echocardiogram 10/20/20:  1. Hypokinesis of the distal septum, distal inferior/inferolateral wall  with overall mild LV dysfunction.  2. Left ventricular ejection fraction, by estimation, is 45 to 50%. The  left ventricle has mildly decreased function. The left ventricle has no  regional wall motion abnormalities. Left ventricular diastolic parameters  are consistent with Grade I  diastolic dysfunction (impaired relaxation).  3. Right ventricular systolic  function is normal. The right ventricular  size is normal.  4. The mitral valve is normal in structure. Trivial mitral valve  regurgitation. No evidence of mitral stenosis.  5. The aortic valve is tricuspid. Aortic valve regurgitation is trivial.  Mild aortic valve sclerosis is present, with no evidence of aortic valve  stenosis.  6. The inferior vena cava is normal in size with greater than 50%  respiratory variability, suggesting right atrial pressure of 3 mmHg.   History of Present Illness     Kristina Nolan is a 65 y.o. female with a history of hyperthyroidism on methimazole (stopped), migraines, HLD, no hx HTN, DM.   Kristina Nolan went to [facility] with chest pain 10/19/20. She reported that she has never had chest pain in the past. One day prior to presentation, she found out that her brother-in-law was electrocuted while working for the [employer]. She was riding in a car after she found out and had some mild chest pain that resolved without intervention.   She then has recurrent symptoms upon waking that did not resolve at which time she presented to the ED for further evaluation.   Hospital Course   In the ED, HsT found to be elevated at 111>325>4492>[id number]>21634. She was then transferred to [facility] and underwent LHC that revealed two vessel CAD including  occlusion of small OM1 with left-to-left and right-to-left collaterals suggesting subacute/chronic nature, 60% distal LCx stenosis involving ostium of OM3, and sequential 50% proximal, 95% mid, and 50% mid RCA lesions.  Mild LAD plaquing with heavy mural calcification is also present.There was low ventricular systolic function with mildly elevated filling pressures. A successful PCI/DES to prox to mid RCA was performed with overlapping stents. Plan is for DAPT with ASA  and Brilinta for at least 12 months alond with aggressive secondary prevention of CAD. Favor medical management of small occluded OM1 and distal  LCx per cath report.   LDL found to be elevated at 100 with a goal <70. She was started on high intensity statin therapy with plans to repeat Lipid panel with LFTs in 6-8 weeks. BPs remained stable with readings of 137/81>127/80>137/81 prior to discharge. Cr was stable in the post cath setting at 0.75. Echocardiogram performed 10/20/20 showed hypokinesis of the distal septum, distal inferior/inferolateral wall with overall mild LV dysfunction with an LVEF at 45 to 50% and G1DD. Due to moderate ischemic cardiomyopathy she was started on low dose losartan with plans for up titration in the OP setting.   Consultants: None   The patient was seen and examined by Dr. Elberta Fortisamnitz who feels that the patient is stable and ready for discharge today, 10/20/20. She has ambulated with cardiac rehabilitation without complications. Cath site stable with no s/s of hematoma.   Did the patient have an acute coronary syndrome (MI, NSTEMI, STEMI, etc) this admission?:  Yes                               AHA/ACC Clinical Performance & Quality Measures: 4. Aspirin prescribed? - Yes 5. ADP Receptor Inhibitor (Plavix/Clopidogrel, Brilinta/Ticagrelor or Effient/Prasugrel) prescribed (includes medically managed patients)? - Yes 6. Beta Blocker prescribed? - Yes 7. High Intensity Statin (Lipitor 40-80mg  or Crestor 20-40mg ) prescribed? - Yes 8. EF assessed during THIS hospitalization? - Yes 9. For EF <40%, was ACEI/ARB prescribed? - Not Applicable (EF >/= 40%) 10. For EF <40%, Aldosterone Antagonist (Spironolactone or Eplerenone) prescribed? - Not Applicable (EF >/= 40%) 11. Cardiac Rehab Phase II ordered (including medically managed patients)? - Yes    ____________  Discharge Vitals Blood pressure 118/72, pulse 67, temperature 99.2 F (37.3 C), temperature source Oral, resp. rate 20, height 5\' 5"  (1.651 m), weight 71.2 kg, SpO2 98 %.  Filed Weights   10/19/20 1027  Weight: 71.2 kg    Labs & Radiologic Studies     CBC Recent Labs    10/19/20 1045 10/19/20 2342  WBC 7.7 10.0  HGB 14.5 12.6  HCT 45.8 37.0  MCV 88.1 83.3  PLT 235 208   Basic Metabolic Panel Recent Labs    16/07/9600/14/22 1045 10/20/20 0838  NA 139 138  K 4.4 4.4  CL 103 103  CO2 24 24  GLUCOSE 144* 133*  BUN 14 9  CREATININE 0.77 0.75  CALCIUM 10.0 9.5   Liver Function Tests Recent Labs    10/19/20 1525  AST 46*  ALT 44  ALKPHOS 117  BILITOT 0.7  PROT 7.1  ALBUMIN 3.7   No results for input(s): LIPASE, AMYLASE in the last 72 hours. High Sensitivity Troponin:   Recent Labs  Lab 10/19/20 1045 10/19/20 1218 10/19/20 1752 10/19/20 2342 10/20/20 0838  TROPONINIHS 111* 325* 4,492* 10,582* 21,634*    BNP Invalid input(s): POCBNP D-Dimer No results for input(s): DDIMER in the last 72 hours. Hemoglobin A1C No results for input(s): HGBA1C in the last 72 hours. Fasting Lipid Panel Recent Labs    10/19/20 1525  CHOL 169  HDL 63  LDLCALC 100*  TRIG 31  CHOLHDL 2.7   Thyroid Function Tests Recent Labs    10/19/20 1525  TSH 1.071  T3FREE 2.9   _____________  DG Chest 1 View  Result Date: 10/19/2020 CLINICAL DATA:  Onset chest tightness yesterday. EXAM: CHEST  1 VIEW COMPARISON:  None. FINDINGS: Lungs clear. Heart size normal. No pneumothorax or pleural fluid. No bony abnormality. IMPRESSION: Negative chest. Electronically Signed   By: Drusilla Kanner M.D.   On: 10/19/2020 12:54   CARDIAC CATHETERIZATION  Result Date: 10/19/2020 Conclusions: 1. Severe two-vessel coronary artery disease, including occlusion of small OM1 with left-to-left and right-to-left collaterals suggesting subacute/chronic nature, 60% distal LCx stenosis involving ostium of OM3, and sequential 50% proximal, 95% mid, and 50% mid RCA lesions.  Mild LAD plaquing with heavy mural calcification is also present. 2. Low normal left ventricular systolic function with mildly elevated filling pressure. 3. Successful PCI to 50% proximal and 95%  mid RCA lesions with overlapping Resolute Onyx 2.5 x 26 mm and 2.25 x 12 mm drug-eluting stents with 0% residual stenosis and TIMI-3 flow. Recommendations: 1. Dual antiplatelet therapy with aspirin and ticagrelor for at least 12 months. 2. Aggressive secondary prevention of coronary artery disease. 3. Favor medical therapy of small occluded OM1 branch and distal LCx. Yvonne Kendall, MD [facility]   ECHOCARDIOGRAM COMPLETE  Result Date: 10/20/2020    ECHOCARDIOGRAM REPORT   Patient Name:   Kristina Nolan Date of Exam: 10/20/2020 Medical Rec #:  [id number]          Height:       65.0 in Accession #:    [id number]         Weight:       157.0 lb Date of Birth:  [birth year]         BSA:          1.785 m Patient Age:    64 years           BP:           137/81 mmHg Patient Gender: F                  HR:           72 bpm. Exam Location:  Inpatient Procedure: 2D Echo, Cardiac Doppler and Color Doppler Indications:    NSTEMI I21.4  History:        Patient has no prior history of Echocardiogram examinations.                 Risk Factors:Dyslipidemia. Hyperthyroidism.  Sonographer:    Elmarie Shiley Dance Referring Phys: [id number] CHRISTOPHER END IMPRESSIONS  1. Hypokinesis of the distal septum, distal inferior/inferolateral wall with overall mild LV dysfunction.  2. Left ventricular ejection fraction, by estimation, is 45 to 50%. The left ventricle has mildly decreased function. The left ventricle has no regional wall motion abnormalities. Left ventricular diastolic parameters are consistent with Grade I diastolic dysfunction (impaired relaxation).  3. Right ventricular systolic function is normal. The right ventricular size is normal.  4. The mitral valve is normal in structure. Trivial mitral valve regurgitation. No evidence of mitral stenosis.  5. The aortic valve is tricuspid. Aortic valve regurgitation is trivial. Mild aortic valve sclerosis is present, with no evidence of aortic valve stenosis.  6. The inferior vena cava  is normal in size with greater than 50% respiratory variability, suggesting right atrial pressure of 3 mmHg. FINDINGS  Left Ventricle: Left ventricular ejection fraction, by estimation, is 45 to 50%. The left ventricle has mildly decreased function. The left ventricle has no regional wall motion abnormalities. The left ventricular internal cavity size was normal in size. There is no left ventricular hypertrophy. Abnormal (paradoxical) septal motion,  consistent with left bundle branch block. Left ventricular diastolic parameters are consistent with Grade I diastolic dysfunction (impaired relaxation). Right Ventricle: The right ventricular size is normal.Right ventricular systolic function is normal. Left Atrium: Left atrial size was normal in size. Right Atrium: Right atrial size was normal in size. Pericardium: There is no evidence of pericardial effusion. Mitral Valve: The mitral valve is normal in structure. Mild mitral annular calcification. Trivial mitral valve regurgitation. No evidence of mitral valve stenosis. Tricuspid Valve: The tricuspid valve is normal in structure. Tricuspid valve regurgitation is trivial. No evidence of tricuspid stenosis. Aortic Valve: The aortic valve is tricuspid. Aortic valve regurgitation is trivial. Mild aortic valve sclerosis is present, with no evidence of aortic valve stenosis. Pulmonic Valve: The pulmonic valve was not well visualized. Pulmonic valve regurgitation is trivial. No evidence of pulmonic stenosis. Aorta: The aortic root is normal in size and structure. Venous: The inferior vena cava is normal in size with greater than 50% respiratory variability, suggesting right atrial pressure of 3 mmHg. IAS/Shunts: No atrial level shunt detected by color flow Doppler. Additional Comments: Hypokinesis of the distal septum, distal inferior/inferolateral wall with overall mild LV dysfunction.  LEFT VENTRICLE PLAX 2D LVIDd:         3.90 cm  Diastology LVIDs:         2.60 cm  LV e'  medial:    5.87 cm/s LV PW:         1.20 cm  LV E/e' medial:  11.5 LV IVS:        0.90 cm  LV e' lateral:   6.96 cm/s LVOT diam:     1.80 cm  LV E/e' lateral: 9.7 LV SV:         54 LV SV Index:   30 LVOT Area:     2.54 cm  RIGHT VENTRICLE             IVC RV Basal diam:  2.30 cm     IVC diam: 1.60 cm RV S prime:     14.00 cm/s TAPSE (M-mode): 2.0 cm LEFT ATRIUM             Index       RIGHT ATRIUM           Index LA diam:        3.80 cm 2.13 cm/m  RA Area:     11.80 cm LA Vol (A2C):   53.7 ml 30.09 ml/m RA Volume:   27.60 ml  15.46 ml/m LA Vol (A4C):   40.3 ml 22.58 ml/m LA Biplane Vol: 46.2 ml 25.89 ml/m  AORTIC VALVE LVOT Vmax:   100.00 cm/s LVOT Vmean:  73.000 cm/s LVOT VTI:    0.213 m  AORTA Ao Root diam: 3.10 cm Ao Asc diam:  3.10 cm MITRAL VALVE MV Area (PHT): 3.99 cm     SHUNTS MV Decel Time: 190 msec     Systemic VTI:  0.21 m MV E velocity: 67.30 cm/s   Systemic Diam: 1.80 cm MV A velocity: 106.00 cm/s MV E/A ratio:  0.63 Olga Millers MD Electronically signed by Olga Millers MD Signature Date/Time: 10/20/2020/11:47:15 AM    Final    Disposition   Pt is being discharged home today in good condition.  Follow-up Plans & Appointments     Follow-up Information     [facility] Follow up.   Why: The office Terrion Gencarelli call with date and time of appointment  Contact information:  [address] [place] [address] [phone]             Discharge Instructions    Amb Referral to Cardiac Rehabilitation   Complete by: As directed    Diagnosis:  NSTEMI Coronary Stents     After initial evaluation and assessments completed: Virtual Based Care may be provided alone or in conjunction with Phase 2 Cardiac Rehab based on patient barriers.: Yes   Call MD for:  difficulty breathing, headache or visual disturbances   Complete by: As directed    Call MD for:  extreme fatigue   Complete by: As directed    Call MD for:   hives   Complete by: As directed    Call MD for:  persistant dizziness or light-headedness   Complete by: As directed    Call MD for:  persistant nausea and vomiting   Complete by: As directed    Call MD for:  redness, tenderness, or signs of infection (pain, swelling, redness, odor or green/yellow discharge around incision site)   Complete by: As directed    Call MD for:  severe uncontrolled pain   Complete by: As directed    Call MD for:  temperature >100.4   Complete by: As directed    Diet - low sodium heart healthy   Complete by: As directed    Discharge instructions   Complete by: As directed    PLEASE DO NOT MISS ANY DOSES OF YOUR BRILINTA!!!! Also keep a log of you blood pressures and bring back to your follow up appt. Please call the office with any questions.   Patients taking blood thinners should generally stay away from medicines like ibuprofen, Advil, Motrin, naproxen, and Aleve due to risk of stomach bleeding. You may take Tylenol as directed or talk to your primary doctor about alternatives.  PLEASE ENSURE THAT YOU DO NOT RUN OUT OF YOUR BRILINTA. This medication is very important to remain on for at least one year. IF you have issues obtaining this medication due to cost please CALL the office 3-5 business days prior to running out in order to prevent missing doses of this medication.   No driving for 3-5 days. No lifting over 5 lbs for 1 week. No sexual activity for 1 week. Keep procedure site clean & dry. If you notice increased pain, swelling, bleeding or pus, call/return!  You may shower, but no soaking baths/hot tubs/pools for 1 week.   Increase activity slowly   Complete by: As directed      Discharge Medications   Allergies as of 10/20/2020   No Known Allergies     Medication List    STOP taking these medications   atenolol 25 MG tablet Commonly known as: TENORMIN     TAKE these medications   aspirin 81 MG chewable tablet Chew 1 tablet (81 mg total)  by mouth daily. Start taking on: October 21, 2020   atorvastatin 80 MG tablet Commonly known as: LIPITOR Take 1 tablet (80 mg total) by mouth every evening. What changed:   medication strength  how much to take  when to take this   cetirizine 10 MG tablet Commonly known as: ZYRTEC Take 1 tablet (10 mg total) by mouth daily.   fluticasone 50 MCG/ACT nasal spray Commonly known as: FLONASE Place 2 sprays into both nostrils daily.   losartan 25 MG tablet Commonly known as: COZAAR Take 0.5 tablets (12.5 mg total) by mouth daily.   methimazole 5 MG  tablet Commonly known as: TAPAZOLE Take 5 mg by mouth daily.   metoprolol succinate 50 MG 24 hr tablet Commonly known as: TOPROL-XL Take 1 tablet (50 mg total) by mouth at bedtime. Take with or immediately following a meal.   nitroGLYCERIN 0.4 MG SL tablet Commonly known as: NITROSTAT Place 1 tablet (0.4 mg total) under the tongue every 5 (five) minutes as needed for chest pain.   ticagrelor 90 MG Tabs tablet Commonly known as: BRILINTA Take 1 tablet (90 mg total) by mouth 2 (two) times daily.   valACYclovir 500 MG tablet Commonly known as: VALTREX TAKE 1 TABLET(S) EVERY DAY BY ORAL ROUTE.       Outstanding Labs/Studies   BMET, CBC  Duration of Discharge Encounter   Greater than 30 minutes including physician time.  Signed, Georgie ChardJill McDaniel, NP 10/20/2020, 12:15 PM    I have seen and examined this patient with Georgie ChardJill McDaniel.  Agree with above, note added to reflect my findings.  On exam, RRR, no murmurs, lungs clear. Patient admitted with NSTEMI. Stent to the RCA. Plan for discharge with follow up in clinic.    Kyjuan Gause M. Mckinnon Glick MD 10/21/2020 9:18 AM

## 2020-10-20 NOTE — TOC Transition Note (Signed)
Transition of Care [facility]) - CM/SW Discharge Note   Patient Details  Name: Kristina Nolan MRN: [id number] Date of Birth: 03-12-1956  [facility] CM/SW Contact:  Leone Haven, RN Phone Number: 10/20/2020, 1:02 PM   Clinical Narrative:    Patient is for dc today, NCM gave patient the 30 day free coupon for brilinta along with the 5.00 copay card. Per the pharmacy her copay is 24.99 today without the coupon. She has transportation at [employer].  She is indep.    Final next level of care: Home/Self Care Barriers to Discharge: No Barriers Identified   Patient Goals and CMS Choice Patient states their goals for this hospitalization and ongoing recovery are:: get better   Choice offered to / list presented to : NA  Discharge Placement                       Discharge Plan and Services                  DME Agency: NA       HH Arranged: NA          Social Determinants of Health (SDOH) Interventions     Readmission Risk Interventions No flowsheet data found.

## 2020-10-20 NOTE — Progress Notes (Signed)
  Echocardiogram 2D Echocardiogram has been performed.  Caitlain Tweed G Zamir Staples 10/20/2020, 10:31 AM

## 2020-10-22 ENCOUNTER — Encounter (HOSPITAL_COMMUNITY): Payer: Self-pay | Admitting: Internal Medicine

## 2020-10-25 NOTE — Progress Notes (Signed)
Cardiology Office Note:   Date:  10/26/2020  NAME:  Kristina Nolan    MRN: [id number] DOB:  06-02-1956   PCP:  Sheliah Hatch, MD  Cardiologist:  No primary care provider on file.   Referring MD: Sheliah Hatch, MD   Chief Complaint  Patient presents with  . Follow-up    Follow-up  . Coronary Artery Disease   History of Present Illness:   Kristina Nolan is a 65 y.o. female with a hx of CAD with NSTEMI, HLD, hyperthyroidism who is being seen today for the evaluation of CAD at the request of Sheliah Hatch, MD. Recent admission 10/19/2020 for NSTEMI. EF 45-50%. PCI to mid RCA. 100% occluded OM1 with collaterals that was managed medically.  She is doing well since leaving the hospital.  She started driving without any issues.  No chest pain.  She is doing light exercise without any limitations.  She is on aspirin ticagrelor without any issues.  She is on Lipitor 80 mg a day.  Needs a repeat lipid profile at some point.  Echocardiogram showed EF 45 to 50%.  We did go over her results of her angiogram as well as echocardiogram.  She is on low-dose metoprolol and low-dose losartan.  We will need to repeat echocardiogram in the next few months as well.  She does not smoke, drink alcohol or use drugs.  There is a strong family history of heart disease.  She overall is doing well post heart cath.  We discussed.  Rehab but she reports she would like to pursue activity on her own.  I do agree with this.  She denies any major symptoms today.  Problem List 1. NSTEMI 10/19/2020 -95% mRCA -> PCI -100% OM1 -> R to L collaterals -30% pLAD -60% dLCX 2. HLD -T chol 169, HDL 63, LDL 100, TG 31 3. LBBB  Past Medical History: Past Medical History:  Diagnosis Date  . Graves' disease   . Hyperlipidemia   . Hyperthyroidism   . Migraines   . NSTEMI (non-ST elevated myocardial infarction) (HCC) 10/19/2020    Past Surgical History: Past Surgical History:  Procedure Laterality Date   . BREAST ENHANCEMENT SURGERY    . CORONARY STENT INTERVENTION N/A 10/19/2020   Procedure: CORONARY STENT INTERVENTION;  Surgeon: Yvonne Kendall, MD;  Location: [facility];  Service: Cardiovascular;  Laterality: N/A;  . LEFT HEART CATH AND CORONARY ANGIOGRAPHY N/A 10/19/2020   Procedure: LEFT HEART CATH AND CORONARY ANGIOGRAPHY;  Surgeon: Yvonne Kendall, MD;  Location: [facility];  Service: Cardiovascular;  Laterality: N/A;  . TUBAL LIGATION      Current Medications: Current Meds  Medication Sig  . aspirin 81 MG chewable tablet Chew 1 tablet (81 mg total) by mouth daily.  . cetirizine (ZYRTEC) 10 MG tablet Take 1 tablet (10 mg total) by mouth daily.  . nitroGLYCERIN (NITROSTAT) 0.4 MG SL tablet Place 1 tablet (0.4 mg total) under the tongue every 5 (five) minutes as needed for chest pain.  . valACYclovir (VALTREX) 500 MG tablet TAKE 1 TABLET(S) EVERY DAY BY ORAL ROUTE.  . [DISCONTINUED] losartan (COZAAR) 25 MG tablet Take 0.5 tablets (12.5 mg total) by mouth daily.  . [DISCONTINUED] metoprolol succinate (TOPROL-XL) 50 MG 24 hr tablet Take 1 tablet (50 mg total) by mouth at bedtime. Take with or immediately following a meal.  . [DISCONTINUED] ticagrelor (BRILINTA) 90 MG TABS tablet Take 1 tablet (90 mg total) by mouth 2 (two) times  daily.     Allergies:    Patient has no known allergies.   Social History: Social History   Socioeconomic History  . Marital status: Married    Spouse name: Not on file  . Number of children: 2  . Years of education: Not on file  . Highest education level: Not on file  Occupational History  . 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 Smoking status: Never Smoker  . Smokeless tobacco: Never Used  Vaping Use  . Vaping Use: Never used  Substance and Sexual Activity  . Alcohol use: No  . Drug use: No  . Sexual activity: Not on file  Other Topics Concern  . Not on file  Social History Narrative  . Not on file    Social Determinants of Health   Financial Resource Strain: Not on file  Food Insecurity: Not on file  Transportation Needs: Not on file  Physical Activity: Not on file  Stress: Not on file  Social Connections: Not on file     Family History: The patient's family history includes Cancer in her father and mother; Heart attack in her brother and father; Thyroid disease in her cousin.  ROS:   All other ROS reviewed and negative. Pertinent positives noted in the HPI.     EKGs/Labs/Other Studies Reviewed:   The following studies were personally reviewed by me today:  EKG:  EKG is ordered today.  The ekg ordered today demonstrates normal sinus rhythm heart rate 81, left bundle branch block, and was personally reviewed by me.   TTE 10/20/2020 1. Hypokinesis of the distal septum, distal inferior/inferolateral wall  with overall mild LV dysfunction.  2. Left ventricular ejection fraction, by estimation, is 45 to 50%. The  left ventricle has mildly decreased function. The left ventricle has no  regional wall motion abnormalities. Left ventricular diastolic parameters  are consistent with Grade I  diastolic dysfunction (impaired relaxation).  3. Right ventricular systolic function is normal. The right ventricular  size is normal.  4. The mitral valve is normal in structure. Trivial mitral valve  regurgitation. No evidence of mitral stenosis.  5. The aortic valve is tricuspid. Aortic valve regurgitation is trivial.  Mild aortic valve sclerosis is present, with no evidence of aortic valve  stenosis.  6. The inferior vena cava is normal in size with greater than 50%  respiratory variability, suggesting right atrial pressure of 3 mmHg.   LHC 10/19/2020 1. Severe two-vessel coronary artery disease, including occlusion of small OM1 with left-to-left and right-to-left collaterals suggesting subacute/chronic nature, 60% distal LCx stenosis involving ostium of OM3, and sequential 50%  proximal, 95% mid, and 50% mid RCA lesions.  Mild LAD plaquing with heavy mural calcification is also present. 2. Low normal left ventricular systolic function with mildly elevated filling pressure. 3. Successful PCI to 50% proximal and 95% mid RCA lesions with overlapping Resolute Onyx 2.5 x 26 mm and 2.25 x 12 mm drug-eluting stents with 0% residual stenosis and TIMI-3 flow.    Recent Labs: 10/19/2020: ALT 44; Hemoglobin 12.6; Platelets 208; TSH 1.071 10/20/2020: BUN 9; Creatinine, Ser 0.75; Potassium 4.4; Sodium 138   Recent Lipid Panel    Component Value Date/Time   CHOL 169 10/19/2020 1525   TRIG 31 10/19/2020 1525   HDL 63 10/19/2020 1525   CHOLHDL 2.7 10/19/2020 1525   VLDL 6 10/19/2020 1525   LDLCALC 100 (H) 10/19/2020 1525    Physical Exam:   VS:  BP 125/75 (BP  Location: Left Arm, Patient Position: Sitting)   Pulse 81   Ht 5\' 5"  (1.651 m)   Wt 156 lb 6.4 oz (70.9 kg)   SpO2 99%   BMI 26.03 kg/m    Wt Readings from Last 3 Encounters:  10/26/20 156 lb 6.4 oz (70.9 kg)  10/19/20 157 lb (71.2 kg)  07/12/20 153 lb 6 oz (69.6 kg)    General: Well nourished, well developed, in no acute distress Head: Atraumatic, normal size  Eyes: PEERLA, EOMI  Neck: Supple, no JVD Endocrine: No thryomegaly Cardiac: Normal S1, S2; RRR; no murmurs, rubs, or gallops Lungs: Clear to auscultation bilaterally, no wheezing, rhonchi or rales  Abd: Soft, nontender, no hepatomegaly  Ext: No edema, pulses 2+, right radial cath site without hematoma Musculoskeletal: No deformities, BUE and BLE strength normal and equal Skin: Warm and dry, no rashes   Neuro: Alert and oriented to person, place, time, and situation, CNII-XII grossly intact, no focal deficits  Psych: Normal mood and affect   ASSESSMENT:   Kenya Kook is a 65 y.o. female who presents for the following: 1. Coronary artery disease involving native coronary artery of native heart without angina pectoris   2. Mixed  hyperlipidemia   3. LBBB (left bundle branch block)   4. Ischemic cardiomyopathy     PLAN:   1. Coronary artery disease involving native coronary artery of native heart without angina pectoris -95% mRCA -> PCI -100% OM1 -> R to L collaterals -30% pLAD -60% dLCX -No chest pain reported.  Cath site looks clean and dry.  No hematoma.  She can resume her normal activity.  No limitations. -Continue DAPT for 1 year.  No elective surgeries. -We will continue metoprolol succinate reduced to 25 mg a day.  Continue losartan as EF is a bit low.  Repeat an echocardiogram in 3 to 6 months. -We will plan to repeat a lipid profile as well to get her LDL less than 70.  2. Mixed hyperlipidemia -Repeat lipid profile in 3 months.  Continue Lipitor 80 mg a day.  3. LBBB (left bundle branch block) -Left bundle branch block noted.  Similar to EKG from the hospital.  4. Ischemic cardiomyopathy -EF 45 to 50%.  Continue metoprolol succinate.  Reduce to 25 mg a day due to low blood pressure.  Continue losartan 12.5 mg daily.  Recheck echo in 3 to 6 months.  No symptoms of heart failure.  Disposition: Return in about 3 months (around 01/24/2021).  Medication Adjustments/Labs and Tests Ordered: Current medicines are reviewed at length with the patient today.  Concerns regarding medicines are outlined above.  Orders Placed This Encounter  Procedures  . Lipid panel  . EKG 12-Lead  . ECHOCARDIOGRAM COMPLETE   Meds ordered this encounter  Medications  . ticagrelor (BRILINTA) 90 MG TABS tablet    Sig: Take 1 tablet (90 mg total) by mouth 2 (two) times daily.    Dispense:  120 tablet    Refill:  3  . metoprolol succinate (TOPROL-XL) 25 MG 24 hr tablet    Sig: Take 1 tablet (25 mg total) by mouth at bedtime. Take with or immediately following a meal.    Dispense:  90 tablet    Refill:  1  . losartan (COZAAR) 25 MG tablet    Sig: Take 0.5 tablets (12.5 mg total) by mouth daily.    Dispense:  60 tablet     Refill:  3  . atorvastatin (LIPITOR) 80 MG tablet  Sig: Take 1 tablet (80 mg total) by mouth every evening.    Dispense:  90 tablet    Refill:  3    Patient Instructions  Medication Instructions:  Decrease metoprolol to 25 mg daily  The current medical regimen is effective;  continue present plan and medications.  *If you need a refill on your cardiac medications before your next appointment, please call your pharmacy*   Lab Work: LIPID (fasting, in 3 months, no lab appointment needed)  If you have labs (blood work) drawn today and your tests are completely normal, you will receive your results only by: Marland Kitchen MyChart Message (if you have MyChart) OR . A paper copy in the mail If you have any lab test that is abnormal or we need to change your treatment, we will call you to review the results.   Testing/Procedures: Echocardiogram - Your physician has requested that you have an echocardiogram. Echocardiography is a painless test that uses sound waves to create images of your heart. It provides your doctor with information about the size and shape of your heart and how well your heart's chambers and valves are working. This procedure takes approximately one hour. There are no restrictions for this procedure. This will be performed at our [facility] location - [address].    Follow-Up: At [facility], you and your health needs are our priority.  As part of our continuing mission to provide you with exceptional heart care, we have created designated Provider Care Teams.  These Care Teams include your primary Cardiologist (physician) and Advanced Practice Providers (APPs -  Physician Assistants and Nurse Practitioners) who all work together to provide you with the care you need, when you need it.  We recommend signing up for the patient portal called "MyChart".  Sign up information is provided on this After Visit Summary.  MyChart is used to connect with patients for  Virtual Visits (Telemedicine).  Patients are able to view lab/test results, encounter notes, upcoming appointments, etc.  Non-urgent messages can be sent to your provider as well.   To learn more about what you can do with MyChart, go to [email or web address].    Your next appointment:   3 month(s)  The format for your next appointment:   In Person  Provider:   Lennie Odor, MD        Time Spent with Patient: I have spent a total of 35 minutes with patient reviewing hospital notes, telemetry, EKGs, labs and examining the patient as well as establishing an assessment and plan that was discussed with the patient.  > 50% of time was spent in direct patient care.  Signed, Lenna Gilford. Flora Lipps, MD [facility]  [address], [address] [place], [place] [address] [phone]  10/26/2020 12:40 PM

## 2020-10-26 ENCOUNTER — Encounter: Payer: Self-pay | Admitting: Cardiovascular Disease

## 2020-10-26 ENCOUNTER — Ambulatory Visit: Payer: BC Managed Care – PPO | Admitting: Cardiovascular Disease

## 2020-10-26 ENCOUNTER — Other Ambulatory Visit: Payer: Self-pay

## 2020-10-26 VITALS — BP 125/75 | HR 81 | Ht 65.0 in | Wt 156.4 lb

## 2020-10-26 DIAGNOSIS — I447 Left bundle-branch block, unspecified: Secondary | ICD-10-CM

## 2020-10-26 DIAGNOSIS — I2510 Atherosclerotic heart disease of native coronary artery without angina pectoris: Secondary | ICD-10-CM

## 2020-10-26 DIAGNOSIS — I255 Ischemic cardiomyopathy: Secondary | ICD-10-CM

## 2020-10-26 DIAGNOSIS — E782 Mixed hyperlipidemia: Secondary | ICD-10-CM | POA: Diagnosis not present

## 2020-10-26 MED ORDER — METOPROLOL SUCCINATE ER 25 MG PO TB24: 25.0000 mg | ORAL_TABLET | Freq: Every day | ORAL | 1 refills | Status: DC

## 2020-10-26 MED ORDER — TICAGRELOR 90 MG PO TABS: 90.0000 mg | ORAL_TABLET | Freq: Two times a day (BID) | ORAL | 3 refills | Status: DC

## 2020-10-26 MED ORDER — ATORVASTATIN CALCIUM 80 MG PO TABS: 80.0000 mg | ORAL_TABLET | Freq: Every evening | ORAL | 3 refills | Status: DC

## 2020-10-26 MED ORDER — LOSARTAN POTASSIUM 25 MG PO TABS: 12.5000 mg | ORAL_TABLET | Freq: Every day | ORAL | 3 refills | Status: DC

## 2020-10-26 NOTE — Patient Instructions (Signed)
Medication Instructions:  Decrease metoprolol to 25 mg daily  The current medical regimen is effective;  continue present plan and medications.  *If you need a refill on your cardiac medications before your next appointment, please call your pharmacy*   Lab Work: LIPID (fasting, in 3 months, no lab appointment needed)  If you have labs (blood work) drawn today and your tests are completely normal, you will receive your results only by:  MyChart Message (if you have MyChart) OR  A paper copy in the mail If you have any lab test that is abnormal or we need to change your treatment, we will call you to review the results.   Testing/Procedures: Echocardiogram - Your physician has requested that you have an echocardiogram. Echocardiography is a painless test that uses sound waves to create images of your heart. It provides your doctor with information about the size and shape of your heart and how well your hearts chambers and valves are working. This procedure takes approximately one hour. There are no restrictions for this procedure. This will be performed at our [facility] location - [address], [address].    Follow-Up: At [facility], you and your health needs are our priority.  As part of our continuing mission to provide you with exceptional heart care, we have created designated Provider Care Teams.  These Care Teams include your primary Cardiologist (physician) and Advanced Practice Providers (APPs -  Physician Assistants and Nurse Practitioners) who all work together to provide you with the care you need, when you need it.  We recommend signing up for the patient portal called "MyChart".  Sign up information is provided on this After Visit Summary.  MyChart is used to connect with patients for Virtual Visits (Telemedicine).  Patients are able to view lab/test results, encounter notes, upcoming appointments, etc.  Non-urgent messages can be sent to your provider as well.   To  learn more about what you can do with MyChart, go to [email or web address].    Your next appointment:   3 month(s)  The format for your next appointment:   In Person  Provider:   Lennie Odor, MD

## 2020-11-13 ENCOUNTER — Telehealth (HOSPITAL_COMMUNITY): Payer: Self-pay | Admitting: Cardiovascular Disease

## 2020-11-13 ENCOUNTER — Other Ambulatory Visit (HOSPITAL_COMMUNITY): Payer: BC Managed Care – PPO

## 2020-11-13 NOTE — Telephone Encounter (Signed)
Patient's echocardiogram for 11/13/20 was cancelled per [facility] Echo tech due to she was not supposed to have for 3-92months per chart. I called patient and left a voicemail this morning not to come for appointment today and to call and we would schedule echo for the future requested time.

## 2020-12-16 ENCOUNTER — Other Ambulatory Visit: Payer: Self-pay | Admitting: Family Medicine

## 2020-12-18 ENCOUNTER — Other Ambulatory Visit: Payer: Self-pay

## 2020-12-18 ENCOUNTER — Emergency Department (HOSPITAL_BASED_OUTPATIENT_CLINIC_OR_DEPARTMENT_OTHER): Payer: BC Managed Care – PPO

## 2020-12-18 ENCOUNTER — Other Ambulatory Visit: Payer: Self-pay | Admitting: Family Medicine

## 2020-12-18 ENCOUNTER — Emergency Department (HOSPITAL_BASED_OUTPATIENT_CLINIC_OR_DEPARTMENT_OTHER)
Admission: EM | Admit: 2020-12-18 | Discharge: 2020-12-18 | Disposition: A | Discharge status: Home / Self Care | Payer: BC Managed Care – PPO | Attending: Emergency Medicine | Admitting: Emergency Medicine

## 2020-12-18 ENCOUNTER — Encounter (HOSPITAL_BASED_OUTPATIENT_CLINIC_OR_DEPARTMENT_OTHER): Payer: Self-pay

## 2020-12-18 DIAGNOSIS — S6992XA Unspecified injury of left wrist, hand and finger(s), initial encounter: Secondary | ICD-10-CM | POA: Diagnosis not present

## 2020-12-18 DIAGNOSIS — S60212A Contusion of left wrist, initial encounter: Secondary | ICD-10-CM | POA: Diagnosis not present

## 2020-12-18 DIAGNOSIS — W228XXA Striking against or struck by other objects, initial encounter: Secondary | ICD-10-CM | POA: Diagnosis not present

## 2020-12-18 DIAGNOSIS — Z7982 Long term (current) use of aspirin: Secondary | ICD-10-CM | POA: Diagnosis not present

## 2020-12-18 DIAGNOSIS — M7989 Other specified soft tissue disorders: Secondary | ICD-10-CM | POA: Diagnosis not present

## 2020-12-18 NOTE — ED Triage Notes (Signed)
Pt arrives with hematoma to left wrist. States she is on blood thinners and thinks she may have hit her wrist on something

## 2020-12-18 NOTE — ED Provider Notes (Signed)
[facility] EMERGENCY DEPARTMENT Provider Note   CSN: [id number] Arrival date & time: 12/18/20  2004     History Chief Complaint  Patient presents with  . Wrist Pain    Kristina Nolan is a 65 y.o. female.  Patient states that she thinks she hit the back of her left hand earlier today.  Has noticed swelling to the back of the left hand.  Is on antiplatelets.  Denies any numbness or tingling or weakness of the hand.  The history is provided by the patient.  Wrist Pain This is a new problem. The current episode started 6 to 12 hours ago. The problem occurs constantly. The problem has not changed since onset.Nothing aggravates the symptoms. Nothing relieves the symptoms. She has tried nothing for the symptoms. The treatment provided no relief.       Past Medical History:  Diagnosis Date  . Graves' disease   . Hyperlipidemia   . Hyperthyroidism   . Migraines   . NSTEMI (non-ST elevated myocardial infarction) (HCC) 10/19/2020    Patient Active Problem List   Diagnosis Date Noted  . CAD S/P percutaneous coronary angioplasty 10/20/2020  . Ischemic cardiomyopathy 10/20/2020  . NSTEMI (non-ST elevated myocardial infarction) (HCC) 10/19/2020  . Vitamin D deficiency 07/12/2020  . Hyperlipidemia 07/08/2018  . Graves' disease 08/08/2015  . Physical exam 06/13/2015  . Hyperthyroidism 02/05/2015  . SOB (shortness of breath) on exertion 01/12/2015    Past Surgical History:  Procedure Laterality Date  . BREAST ENHANCEMENT SURGERY    . CORONARY STENT INTERVENTION N/A 10/19/2020   Procedure: CORONARY STENT INTERVENTION;  Surgeon: Yvonne Kendall, MD;  Location: [facility];  Service: Cardiovascular;  Laterality: N/A;  . LEFT HEART CATH AND CORONARY ANGIOGRAPHY N/A 10/19/2020   Procedure: LEFT HEART CATH AND CORONARY ANGIOGRAPHY;  Surgeon: Yvonne Kendall, MD;  Location: [facility] CV LAB;  Service: Cardiovascular;  Laterality: N/A;  . TUBAL LIGATION       OB  History   No obstetric history on file.     Family History  Problem Relation Age of Onset  . Cancer Mother        lung  . Cancer Father        adrenal  . Heart attack Father   . Heart attack Brother   . Thyroid disease Cousin     Social History   Tobacco Use  . Smoking status: Never Smoker  . Smokeless tobacco: Never Used  Vaping Use  . Vaping Use: Never used  Substance Use Topics  . Alcohol use: No  . Drug use: No    Home Medications Prior to Admission medications   Medication Sig Start Date End Date Taking? Authorizing Provider  aspirin 81 MG chewable tablet Chew 1 tablet (81 mg total) by mouth daily. 10/21/20   Filbert Schilder, NP  atorvastatin (LIPITOR) 80 MG tablet Take 1 tablet (80 mg total) by mouth every evening. 10/26/20   O'Neal, Ronnald Ramp, MD  cetirizine (ZYRTEC) 10 MG tablet Take 1 tablet (10 mg total) by mouth daily. 07/12/20   Sheliah Hatch, MD  fluticasone (FLONASE) 50 MCG/ACT nasal spray Place 2 sprays into both nostrils daily. Patient not taking: Reported on 10/26/2020 07/12/20   Sheliah Hatch, MD  losartan (COZAAR) 25 MG tablet Take 0.5 tablets (12.5 mg total) by mouth daily. 10/26/20   O'NealRonnald Ramp, MD  methimazole (TAPAZOLE) 5 MG tablet Take 5 mg by mouth daily. Patient not taking: Reported on  10/26/2020    [provider]  metoprolol succinate (TOPROL-XL) 25 MG 24 hr tablet Take 1 tablet (25 mg total) by mouth at bedtime. Take with or immediately following a meal. 10/26/20   O'Neal, Ronnald Ramp, MD  nitroGLYCERIN (NITROSTAT) 0.4 MG SL tablet Place 1 tablet (0.4 mg total) under the tongue every 5 (five) minutes as needed for chest pain. 10/20/20   Georgie Chard D, NP  ticagrelor (BRILINTA) 90 MG TABS tablet Take 1 tablet (90 mg total) by mouth 2 (two) times daily. 10/26/20   O'NealRonnald Ramp, MD  valACYclovir (VALTREX) 500 MG tablet TAKE 1 TABLET(S) EVERY DAY BY ORAL ROUTE. 02/27/15   [provider]     Allergies    Patient has no known allergies.  Review of Systems   Review of Systems  Constitutional: Negative for fever.  Musculoskeletal: Positive for joint swelling. Negative for arthralgias and myalgias.  Skin: Positive for color change. Negative for pallor, rash and wound.  Neurological: Negative for weakness and numbness.    Physical Exam Updated Vital Signs  ED Triage Vitals  Enc Vitals Group     BP 12/18/20 2022 (!) 142/88     Pulse Rate 12/18/20 2022 90     Resp 12/18/20 2022 18     Temp 12/18/20 2022 98.2 F (36.8 C)     Temp Source 12/18/20 2022 Oral     SpO2 12/18/20 2022 99 %     Weight 12/18/20 2021 148 lb (67.1 kg)     Height 12/18/20 2021 5\' 5"  (1.651 m)     Head Circumference --      Peak Flow --      Pain Score 12/18/20 2021 1     Pain Loc --      Pain Edu? --      Excl. in GC? --     Physical Exam Constitutional:      General: She is not in acute distress.    Appearance: She is not ill-appearing.  Cardiovascular:     Pulses: Normal pulses.  Musculoskeletal:        General: Swelling present. No tenderness. Normal range of motion.     Comments: Swelling to the back of the left wrist with some bruising, normal range of motion at the left wrist joint  Skin:    Findings: Bruising present.     Comments: Bruising and swelling to the back of the left wrist  Neurological:     General: No focal deficit present.     Mental Status: She is alert.     Sensory: No sensory deficit.     Motor: No weakness.     Comments: 5+ out of 5 strength in the left upper extremity, normal sensation the left upper extremity     ED Results / Procedures / Treatments   Labs (all labs ordered are listed, but only abnormal results are displayed) Labs Reviewed - No data to display  EKG None  Radiology DG Wrist Complete Left  Result Date: 12/18/2020 CLINICAL DATA:  Left wrist swelling. EXAM: LEFT WRIST - COMPLETE 3+ VIEW COMPARISON:  None. FINDINGS: No evidence for  an acute fracture. No subluxation or dislocation. No worrisome lytic or sclerotic osseous abnormality. Posterior soft tissue swelling evident. IMPRESSION: 1. No acute bony abnormality. 2. Posterior soft tissue swelling. Electronically Signed   By: 12/20/2020 M.D.   On: 12/18/2020 21:33    Procedures Procedures   Medications Ordered in ED Medications - No data to  display  ED Course  I have reviewed the triage vital signs and the nursing notes.  Pertinent labs & imaging results that were available during my care of the patient were reviewed by me and considered in my medical decision making (see chart for details).    MDM Rules/Calculators/A&P                          Kristina Nolan is here with swelling to the back of her left wrist.  She thinks that she hit her left wrist earlier tonight on something.  Denies any real specific trauma.  She is on antiplatelets including aspirin and Brilinta for recent cardiac stent.  Patient has no fracture on x-ray.  She has normal range of motion at the wrist and no concern for septic joint.  She was neurovascularly neuromuscularly intact.  She has a hematoma to the back of the left wrist likely from small trauma.  She has good strength and sensation.  Recommend ice.  Recommend follow-up with primary care doctor if symptoms do not resolve.  Have low suspicion for the cyst.  No fever, normal vitals.  This chart was dictated using voice recognition software.  Despite best efforts to proofread,  errors can occur which can change the documentation meaning.   Final Clinical Impression(s) / ED Diagnoses Final diagnoses:  Traumatic hematoma of left wrist, initial encounter    Rx / DC Orders ED Discharge Orders    None       Virgina Norfolk, DO 12/18/20 2157

## 2020-12-18 NOTE — ED Notes (Signed)
Ice pack given pt to place on lt wrist

## 2021-01-23 ENCOUNTER — Other Ambulatory Visit: Payer: Self-pay | Admitting: *Deleted

## 2021-01-23 DIAGNOSIS — E782 Mixed hyperlipidemia: Secondary | ICD-10-CM

## 2021-01-23 LAB — LIPID PANEL
Chol/HDL Ratio: 2.6 ratio (ref 0.0–4.4)
Cholesterol, Total: 161 mg/dL (ref 100–199)
HDL: 63 mg/dL (ref >39)
LDL Chol Calc (NIH): 82 mg/dL (ref 0–99)
Triglycerides: 89 mg/dL (ref 0–149)
VLDL Cholesterol Cal: 16 mg/dL (ref 5–40)

## 2021-01-27 NOTE — Progress Notes (Signed)
Cardiology Office Note:   Date:  01/29/2021  NAME:  Kristina Nolan    MRN: [id number] DOB:  1956-06-12   PCP:  Sheliah Hatch, MD  Cardiologist:  No primary care provider on file.  Electrophysiologist:  None   Referring MD: Sheliah Hatch, MD   Chief Complaint  Patient presents with  . Follow-up    3 months.   History of Present Illness:   Kristina Nolan is a 65 y.o. female with a hx of CAD, HLD, LBBB who presents for follow-up. NSTEMI 10/19/2020. EF 45-50%. Repeat TTE this summer. LDL not at goal despite 80 mg lipitor. Needs zetia vs PCSK9 inhibitor.  She seems to be doing well.  Reports some shortness of breath.  This is a side effect of Brilinta.  She reports she would like to continue this for now.  We did discuss switching to Plavix.  She will let us know if she would like to do this.  She is exercising 45 minutes/day 4 days/week.  This is walking.  No chest pain reported.  She does report roughly 6 episodes of blurry vision in her right eye.  She reports she feels migraine-like symptoms.  She has not having migraines that she is had in the past.  No strokelike symptoms are reported.  No weakness or slurred speech.  We discussed pursuing a brain MRI.  She reports she would like to hold off for now.  She also will discuss this with her primary care physician.  We discussed that her LDL cholesterol needs to be lower.  I did give her the option of Zetia versus PCSK9 inhibitor.  She would like to proceed with a PCSK9 inhibitor.  She is working on her diet.  I reported that a glass of red wine nightly is okay.  She likely will reduce her dose of Lipitor when going on a PCSK9 inhibitor.  She also reports that she has 2 sons who likely need preventive screening.  We will be more than happy to provide this.  Blood pressure is 134/70.  Repeat echocardiogram planned for June.  Denies any symptoms in office.  No major bleeding or bruising.  Problem List 1. NSTEMI 10/19/2020 -95% mRCA  -> PCI -100% OM1 -> R to L collaterals -30% pLAD -60% dLCX 2. HLD -T chol 161, HDL 63, LDL 82, TG 89 3. LBBB 4. Ischemic CM -EF 45-50% (inferior/inferolateral WMA)  Past Medical History: Past Medical History:  Diagnosis Date  . Graves' disease   . Hyperlipidemia   . Hyperthyroidism   . Migraines   . NSTEMI (non-ST elevated myocardial infarction) (HCC) 10/19/2020    Past Surgical History: Past Surgical History:  Procedure Laterality Date  . BREAST ENHANCEMENT SURGERY    . CORONARY STENT INTERVENTION N/A 10/19/2020   Procedure: CORONARY STENT INTERVENTION;  Surgeon: Yvonne Kendall, MD;  Location: [facility];  Service: Cardiovascular;  Laterality: N/A;  . LEFT HEART CATH AND CORONARY ANGIOGRAPHY N/A 10/19/2020   Procedure: LEFT HEART CATH AND CORONARY ANGIOGRAPHY;  Surgeon: Yvonne Kendall, MD;  Location: [facility];  Service: Cardiovascular;  Laterality: N/A;  . TUBAL LIGATION      Current Medications: Current Meds  Medication Sig  . aspirin 81 MG chewable tablet Chew 1 tablet (81 mg total) by mouth daily.  Marland Kitchen atorvastatin (LIPITOR) 80 MG tablet Take 1 tablet (80 mg total) by mouth every evening.  . cetirizine (ZYRTEC) 10 MG tablet Take 1 tablet (10 mg total) by  mouth daily.  . fluticasone (FLONASE) 50 MCG/ACT nasal spray Place 2 sprays into both nostrils daily.  Marland Kitchen losartan (COZAAR) 25 MG tablet Take 0.5 tablets (12.5 mg total) by mouth daily.  . methimazole (TAPAZOLE) 5 MG tablet Take 5 mg by mouth daily.  . metoprolol succinate (TOPROL-XL) 25 MG 24 hr tablet Take 1 tablet (25 mg total) by mouth at bedtime. Take with or immediately following a meal.  . nitroGLYCERIN (NITROSTAT) 0.4 MG SL tablet Place 1 tablet (0.4 mg total) under the tongue every 5 (five) minutes as needed for chest pain.  . ticagrelor (BRILINTA) 90 MG TABS tablet Take 1 tablet (90 mg total) by mouth 2 (two) times daily.  . valACYclovir (VALTREX) 500 MG tablet TAKE 1 TABLET(S) EVERY DAY BY  ORAL ROUTE.     Allergies:    Patient has no known allergies.   Social History: Social History   Socioeconomic History  . Marital status: Married    Spouse name: Not on file  . Number of children: 2  . Years of education: Not on file  . Highest education level: Not on file  Occupational History  . Occupation: Chief of Staff work  Tobacco Use  . Smoking status: Never Smoker  . Smokeless tobacco: Never Used  Vaping Use  . Vaping Use: Never used  Substance and Sexual Activity  . Alcohol use: No  . Drug use: No  . Sexual activity: Not on file  Other Topics Concern  . Not on file  Social History Narrative  . Not on file   Social Determinants of Health   Financial Resource Strain: Not on file  Food Insecurity: Not on file  Transportation Needs: Not on file  Physical Activity: Not on file  Stress: Not on file  Social Connections: Not on file     Family History: The patient's family history includes Cancer in her father and mother; Heart attack in her brother and father; Thyroid disease in her cousin.  ROS:   All other ROS reviewed and negative. Pertinent positives noted in the HPI.     EKGs/Labs/Other Studies Reviewed:   The following studies were personally reviewed by me today:  TTE 10/20/2020 1. Hypokinesis of the distal septum, distal inferior/inferolateral wall  with overall mild LV dysfunction.  2. Left ventricular ejection fraction, by estimation, is 45 to 50%. The  left ventricle has mildly decreased function. The left ventricle has no  regional wall motion abnormalities. Left ventricular diastolic parameters  are consistent with Grade I  diastolic dysfunction (impaired relaxation).  3. Right ventricular systolic function is normal. The right ventricular  size is normal.  4. The mitral valve is normal in structure. Trivial mitral valve  regurgitation. No evidence of mitral stenosis.  5. The aortic valve is tricuspid. Aortic valve  regurgitation is trivial.  Mild aortic valve sclerosis is present, with no evidence of aortic valve  stenosis.  6. The inferior vena cava is normal in size with greater than 50%  respiratory variability, suggesting right atrial pressure of 3 mmHg.   Recent Labs: 10/19/2020: ALT 44; Hemoglobin 12.6; Platelets 208; TSH 1.071 10/20/2020: BUN 9; Creatinine, Ser 0.75; Potassium 4.4; Sodium 138   Recent Lipid Panel    Component Value Date/Time   CHOL 161 01/23/2021 0818   TRIG 89 01/23/2021 0818   HDL 63 01/23/2021 0818   CHOLHDL 2.6 01/23/2021 0818   CHOLHDL 2.7 10/19/2020 1525   VLDL 6 10/19/2020 1525   LDLCALC 82 01/23/2021 0818  Physical Exam:   VS:  BP 134/70 (BP Location: Left Arm, Patient Position: Sitting, Cuff Size: Normal)   Pulse 79   Ht 5' 5.5" (1.664 m)   Wt 150 lb (68 kg)   BMI 24.58 kg/m    Wt Readings from Last 3 Encounters:  01/29/21 150 lb (68 kg)  12/18/20 148 lb (67.1 kg)  10/26/20 156 lb 6.4 oz (70.9 kg)    General: Well nourished, well developed, in no acute distress Head: Atraumatic, normal size  Eyes: PEERLA, EOMI  Neck: Supple, no JVD Endocrine: No thryomegaly Cardiac: Normal S1, S2; RRR; no murmurs, rubs, or gallops Lungs: Clear to auscultation bilaterally, no wheezing, rhonchi or rales  Abd: Soft, nontender, no hepatomegaly  Ext: No edema, pulses 2+ Musculoskeletal: No deformities, BUE and BLE strength normal and equal Skin: Warm and dry, no rashes   Neuro: Alert and oriented to person, place, time, and situation, CNII-XII grossly intact, no focal deficits  Psych: Normal mood and affect   ASSESSMENT:   Landry Corporalancy Shore Virtue is a [age] y.o. female who presents for the following: 1. Coronary artery disease involving native coronary artery of native heart without angina pectoris   2. Mixed hyperlipidemia   3. Ischemic cardiomyopathy   4. LBBB (left bundle branch block)     PLAN:   1. Coronary artery disease involving native coronary artery of  native heart without angina pectoris 2. Mixed hyperlipidemia 3. Ischemic cardiomyopathy 4. LBBB (left bundle branch block) -Non-STEMI 10/19/2020.  95% stenosed mid RCA status post PCI.  She does have residual disease including 100% occluded obtuse marginal 1 branch with right to left collaterals.  She also has 60% distal left circumflex disease. -No major symptoms.  Continue aspirin Brilinta.  She will complete 1 year of DAPT.  She does report some shortness of breath but like to continue her Brilinta for now.  We can easily switch to Plavix. -Her blurry vision does bother me.  I did offer her brain MRI.  She will follow this for now.  She will also discussed with her primary care physician her migraines.  Her migraines should not be worsening from her cardiac medications. -LDL cholesterol still not at goal.  On Lipitor 80 mg daily.  She does report some myalgias with this.  We will reduce to 40 mg daily.  Referral to pharmacy clinic for PCSK9 inhibitor initiation. -EF was 45-50%.  She did have inferior inferolateral wall motion abnormalities.  Repeat echocardiogram in June.  I suspect this will have resolved. -She is exercising enough.  Diet is improved.  Blood pressure well controlled.  Overall at goal.  Just need to get her LDL cholesterol a bit lower.  Disposition: Return in about 6 months (around 07/31/2021).  Medication Adjustments/Labs and Tests Ordered: Current medicines are reviewed at length with the patient today.  Concerns regarding medicines are outlined above.  Orders Placed This Encounter  Procedures  . AMB Referral to [facility]   No orders of the defined types were placed in this encounter.   Patient Instructions  Medication Instructions:  Decrease Lipitor to 40 mg daily   *If you need a refill on your cardiac medications before your next appointment, please call your pharmacy*   Follow-Up: At [facility], you and your health needs are our  priority.  As part of our continuing mission to provide you with exceptional heart care, we have created designated Provider Care Teams.  These Care Teams include your primary Cardiologist (physician)  and Advanced Practice Providers (APPs -  Physician Assistants and Nurse Practitioners) who all work together to provide you with the care you need, when you need it.  We recommend signing up for the patient portal called "MyChart".  Sign up information is provided on this After Visit Summary.  MyChart is used to connect with patients for Virtual Visits (Telemedicine).  Patients are able to view lab/test results, encounter notes, upcoming appointments, etc.  Non-urgent messages can be sent to your provider as well.   To learn more about what you can do with MyChart, go to [email or web address].    Your next appointment:   6 month(s)  The format for your next appointment:   In Person  Provider:   Lennie Odor, MD   Other Instructions We will refer you over to our Pharmacy Team to advise of injectable medication.     Time Spent with Patient: I have spent a total of 35 minutes with patient reviewing hospital notes, telemetry, EKGs, labs and examining the patient as well as establishing an assessment and plan that was discussed with the patient.  > 50% of time was spent in direct patient care.  Signed, Lenna Gilford. Flora Lipps, MD, [facility]  [facility]  [address] [place], [place] [address] [phone]  01/29/2021 11:36 AM

## 2021-01-29 ENCOUNTER — Encounter: Payer: Self-pay | Admitting: Cardiovascular Disease

## 2021-01-29 ENCOUNTER — Other Ambulatory Visit: Payer: Self-pay

## 2021-01-29 ENCOUNTER — Ambulatory Visit: Payer: BC Managed Care – PPO | Admitting: Cardiovascular Disease

## 2021-01-29 VITALS — BP 134/70 | HR 79 | Ht 65.5 in | Wt 150.0 lb

## 2021-01-29 DIAGNOSIS — I447 Left bundle-branch block, unspecified: Secondary | ICD-10-CM

## 2021-01-29 DIAGNOSIS — I2510 Atherosclerotic heart disease of native coronary artery without angina pectoris: Secondary | ICD-10-CM | POA: Diagnosis not present

## 2021-01-29 DIAGNOSIS — I255 Ischemic cardiomyopathy: Secondary | ICD-10-CM

## 2021-01-29 DIAGNOSIS — E782 Mixed hyperlipidemia: Secondary | ICD-10-CM | POA: Diagnosis not present

## 2021-01-29 NOTE — Patient Instructions (Signed)
Medication Instructions:  Decrease Lipitor to 40 mg daily   *If you need a refill on your cardiac medications before your next appointment, please call your pharmacy*   Follow-Up: At [facility], you and your health needs are our priority.  As part of our continuing mission to provide you with exceptional heart care, we have created designated Provider Care Teams.  These Care Teams include your primary Cardiologist (physician) and Advanced Practice Providers (APPs -  Physician Assistants and Nurse Practitioners) who all work together to provide you with the care you need, when you need it.  We recommend signing up for the patient portal called "MyChart".  Sign up information is provided on this After Visit Summary.  MyChart is used to connect with patients for Virtual Visits (Telemedicine).  Patients are able to view lab/test results, encounter notes, upcoming appointments, etc.  Non-urgent messages can be sent to your provider as well.   To learn more about what you can do with MyChart, go to [email or web address].    Your next appointment:   6 month(s)  The format for your next appointment:   In Person  Provider:   Lennie Odor, MD   Other Instructions We will refer you over to our Pharmacy Team to advise of injectable medication.

## 2021-02-03 NOTE — Progress Notes (Signed)
Patient ID: Kristina Nolan                 DOB: [birth year]                    MRN: [id number]     HPI: Kristina Nolan is a 65 y.o. female patient referred to lipid clinic by Dr. Flora Lipps. PMH is significant for CAD, HLD, LBBB, HTN, migraines. NSTEMI 10/19/20 - 95% stenosed mid RCA status post PCI.  She does have residual disease including 100% occluded obtuse marginal 1 branch with right to left collaterals.  She also has 60% distal left circumflex disease. EF 45-50% at that time.   At last visit with Dr. Flora Lipps 01/29/21, LDL was not at goal despite taking atorvastatin 80 mg daily which was initiated after her NSTEMI in 10/2020. Reported working on her diet and exercising 45 mins/day 4x/week. Reported some myalgias with atorvastatin 80 mg daily, dose was reduced to 40 mg daily.   Today, patient reports she has still been taking atorvastatin 80 mg daily. She endorses elbow joint pain that started about 1 month after increasing the dose to 80 mg after her MI in January. The 20 mg dose she was on prior to her MI did not cause any joint pain. Reports no missed doses. Patient reports that everyone in her family has high cholesterol. Her brother died of an MI at age 58. Her sister has had a stroke and her mother had an ASCVD event as well. On physical examination, patient has arcus cornealis. No signs of tendinous xanthomata. She is very interested in starting a PCSK9 inhibitor.   Current Medications: atorvastatin 80 mg daily Intolerances: None Risk Factors: ASCVD, HLD, HTN, family history LDL goal: <55 mg/dL  Diet:  -Breakfast: Oatmeal with honey and blueberries and turmeric, sometimes add a dab of olive oil -Lunch: Greek salad -Dinner: Spaghetti with ground [place], no red meat, vegetables -Snacks/drinks: unsweetened black tea, water  Exercise: 45 mins treadmill 4x/week  Family History: Cancer in her father and mother; Heart attack in her brother (at age 68) and father; CVA in sister; MI in  mother. Thyroid disease in her cousin  Social History: Never smoker  Labs: 01/23/21: TC 161, TG 89, HDL 63, LDL 82 (on atorvastatin 80 mg daily) 10/19/20: TC 169, TG 31, HDL 63, LDL 100 (on atorvastatin 20 mg daily) 07/12/20: TC 255, TG 91, HDL 67, LDL 170 (on no antihyperlipidemic tx)  Past Medical History:  Diagnosis Date  . Graves' disease   . Hyperlipidemia   . Hyperthyroidism   . Migraines   . NSTEMI (non-ST elevated myocardial infarction) (HCC) 10/19/2020    Current Outpatient Medications on File Prior to Visit  Medication Sig Dispense Refill  . aspirin 81 MG chewable tablet Chew 1 tablet (81 mg total) by mouth daily. 90 tablet 3  . atorvastatin (LIPITOR) 80 MG tablet Take 1 tablet (80 mg total) by mouth every evening. 90 tablet 3  . cetirizine (ZYRTEC) 10 MG tablet Take 1 tablet (10 mg total) by mouth daily. 30 tablet 11  . fluticasone (FLONASE) 50 MCG/ACT nasal spray Place 2 sprays into both nostrils daily. 16 mL 6  . losartan (COZAAR) 25 MG tablet Take 0.5 tablets (12.5 mg total) by mouth daily. 60 tablet 3  . methimazole (TAPAZOLE) 5 MG tablet Take 5 mg by mouth daily.    . metoprolol succinate (TOPROL-XL) 25 MG 24 hr tablet Take 1 tablet (25 mg total) by mouth at  bedtime. Take with or immediately following a meal. 90 tablet 1  . nitroGLYCERIN (NITROSTAT) 0.4 MG SL tablet Place 1 tablet (0.4 mg total) under the tongue every 5 (five) minutes as needed for chest pain. 25 tablet 0  . ticagrelor (BRILINTA) 90 MG TABS tablet Take 1 tablet (90 mg total) by mouth 2 (two) times daily. 120 tablet 3  . valACYclovir (VALTREX) 500 MG tablet TAKE 1 TABLET(S) EVERY DAY BY ORAL ROUTE.  10   No current facility-administered medications on file prior to visit.    No Known Allergies  Assessment/Plan:  1. Hyperlipidemia - LDL is above goal <55 mg/dL on atorvastatin 80 mg daily. Dutch criteria score is 6 (LDL 155-189, premature family history of ASCVD, arcus cornealis) which indicates  probably familial hypercholesterolemia. Given this, patient would benefit from addition of PCSK9 inhibitor therapy to bring LDL to goal and prevent risk of future ASCVD events. Will submit prior authorization for Repatha or Praluent and update patient on status of coverage/cost. Educated patient on proper administration and storage of PCSK9i. Continue atorvastatin 80 mg daily. Could consider switching atorvastatin to rosuvastatin in the future to see if joint pain improves.   Pervis Hocking, PharmD PGY1 Pharmacy Resident 02/04/2021 10:29 AM

## 2021-02-04 ENCOUNTER — Encounter: Payer: Self-pay | Admitting: Pharmacist Clinician (PhC)/ Clinical Pharmacy Specialist

## 2021-02-04 ENCOUNTER — Other Ambulatory Visit: Payer: Self-pay

## 2021-02-04 ENCOUNTER — Telehealth: Payer: Self-pay

## 2021-02-04 ENCOUNTER — Ambulatory Visit (INDEPENDENT_AMBULATORY_CARE_PROVIDER_SITE_OTHER): Payer: BC Managed Care – PPO | Admitting: Pharmacist Clinician (PhC)/ Clinical Pharmacy Specialist

## 2021-02-04 DIAGNOSIS — E782 Mixed hyperlipidemia: Secondary | ICD-10-CM

## 2021-02-04 DIAGNOSIS — E785 Hyperlipidemia, unspecified: Secondary | ICD-10-CM

## 2021-02-04 MED ORDER — REPATHA SURECLICK 140 MG/ML ~~LOC~~ SOAJ: 140.0000 mg | SUBCUTANEOUS | 11 refills | Status: DC

## 2021-02-04 NOTE — Telephone Encounter (Signed)
m pt to start repatha 140 every two week injection and instructed to call if the medication is unaffordable and to complete fasting lab work 2 months in

## 2021-02-04 NOTE — Patient Instructions (Signed)
Nice to see you today!  Keep up the good work with diet and exercise. Aim for a diet full of vegetables, fruit and lean meats (chicken, [place], fish). Try to limit carbs (bread, pasta, sugar, rice) and red meat consumption.  Your goal LDL is <55 mg/dL, you're currently at 82 mg/dL  Medication Changes: Continue atorvastatin 80 mg daily.   We are submitting a prior authorization to your insurance for either Repatha or Praluent. We will call you when we hear back and send in your prescription at that time.   Please give [place] a call with any questions or concerns.  For Repatha or Praluent, inject once every other week (any day of the week that works for you) into the fatty skin of stomach, upper outer thigh or back of the arm. Clean the site with soap and warm water or an alcohol pad. Keep the medication in the fridge until you are ready to give your dose, then take it out and let warm up to room temperature for 30-60 mins.

## 2021-03-03 IMAGING — DX DG CHEST 1V
1 series · 1 of 1 positions shown · non-contrast
Comparison: None.

CLINICAL DATA: Onset chest tightness yesterday.

EXAM:
CHEST  1 VIEW

[chest ap]
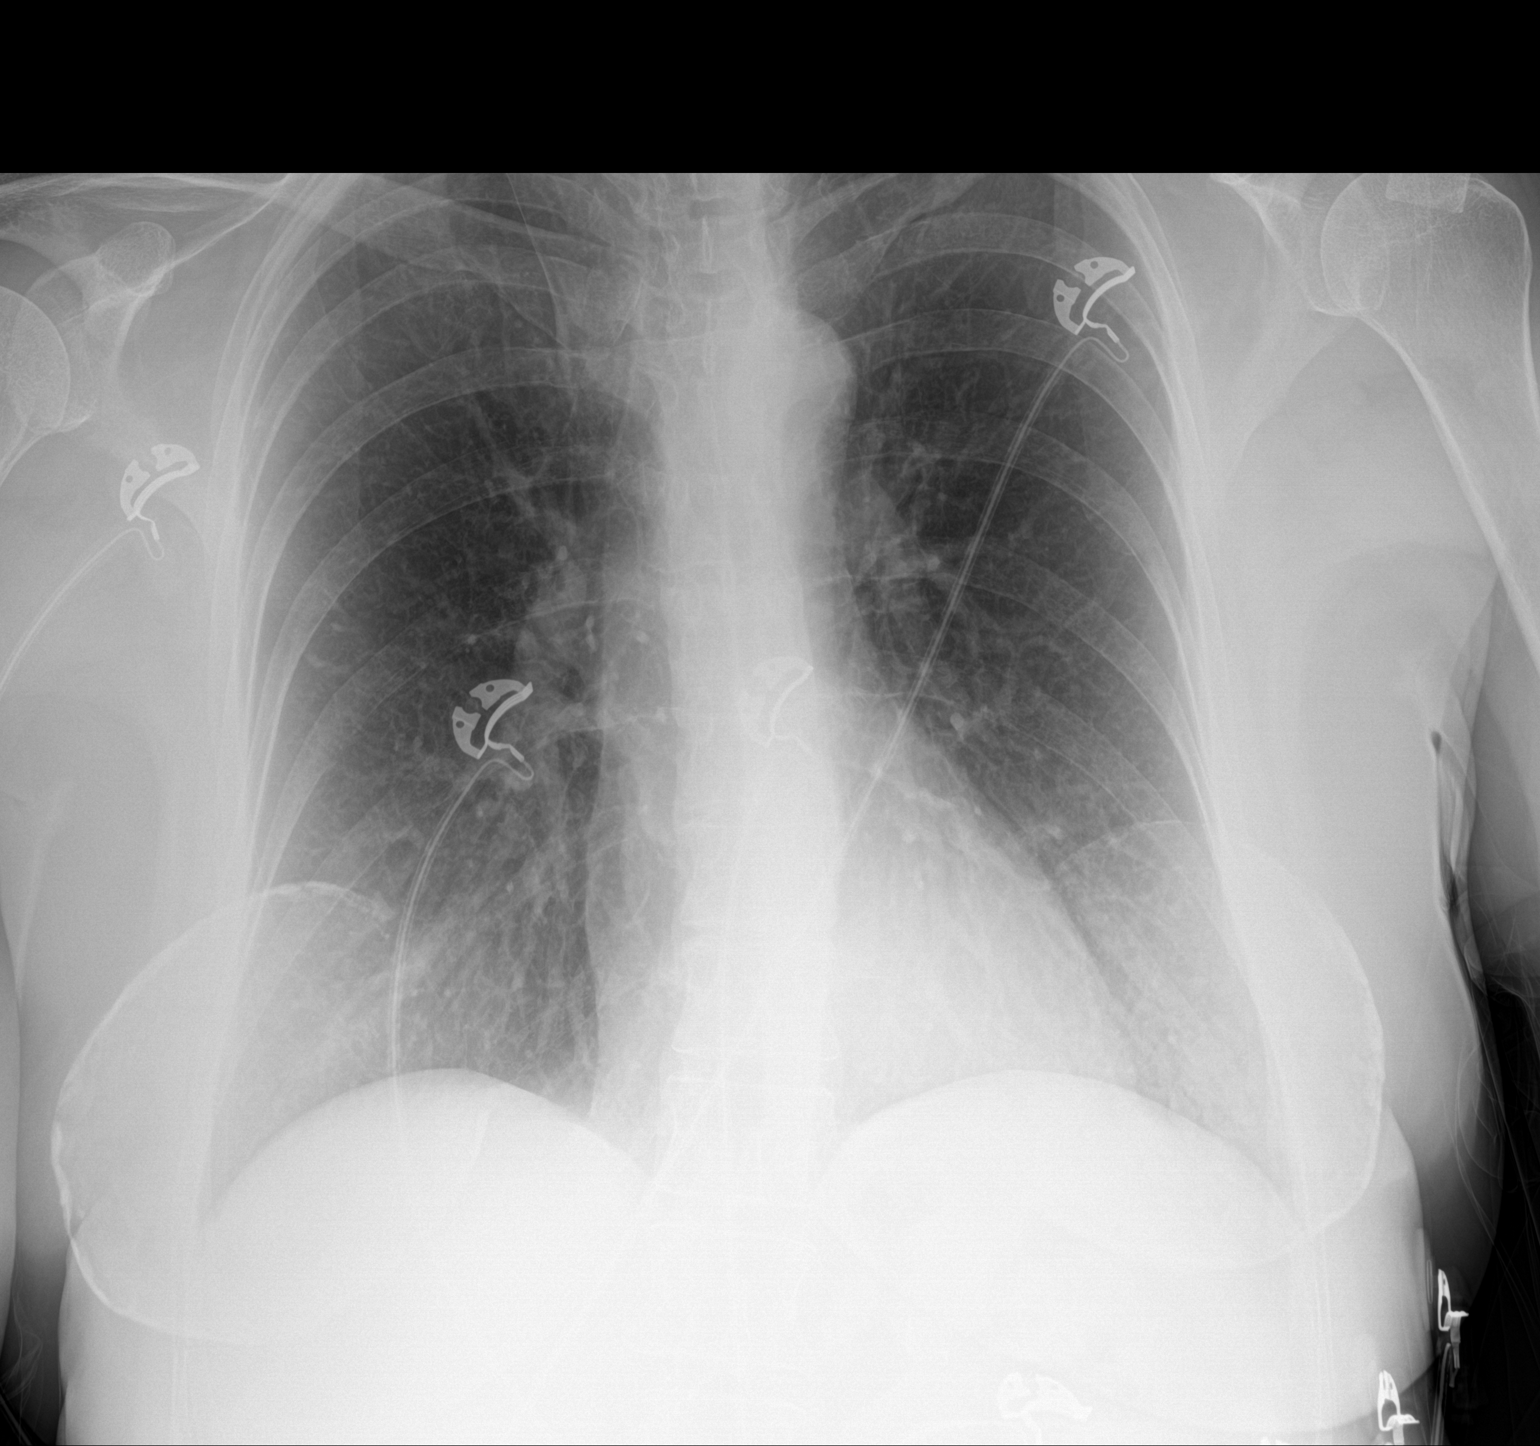

[1 of 1 positions shown; findings below may reference images not displayed]

FINDINGS: Lungs clear. Heart size normal. No pneumothorax or pleural fluid. No
bony abnormality.
IMPRESSION: Negative chest.

## 2021-03-27 ENCOUNTER — Other Ambulatory Visit: Payer: Self-pay

## 2021-03-27 ENCOUNTER — Ambulatory Visit (HOSPITAL_COMMUNITY): Payer: BC Managed Care – PPO | Attending: Cardiology

## 2021-03-27 DIAGNOSIS — I255 Ischemic cardiomyopathy: Secondary | ICD-10-CM | POA: Diagnosis not present

## 2021-03-27 LAB — ECHOCARDIOGRAM COMPLETE
Area-P 1/2: 3.77 cm2
S' Lateral: 3 cm

## 2021-03-27 MED ORDER — PERFLUTREN LIPID MICROSPHERE
1.0000 mL | INTRAVENOUS | Status: AC | PRN
  Administered 2021-03-27: 1 mL via INTRAVENOUS

## 2021-04-03 ENCOUNTER — Encounter: Payer: Self-pay | Admitting: *Deleted

## 2021-04-10 ENCOUNTER — Other Ambulatory Visit: Payer: Self-pay | Admitting: Cardiovascular Disease

## 2021-09-11 DIAGNOSIS — E785 Hyperlipidemia, unspecified: Secondary | ICD-10-CM | POA: Diagnosis not present

## 2021-09-11 LAB — HEPATIC FUNCTION PANEL
ALT: 128 IU/L — ABNORMAL HIGH (ref 0–32)
AST: 138 IU/L — ABNORMAL HIGH (ref 0–40)
Albumin: 4.4 g/dL (ref 3.8–4.8)
Alkaline Phosphatase: 155 IU/L — ABNORMAL HIGH (ref 44–121)
Bilirubin Total: 0.4 mg/dL (ref 0.0–1.2)
Bilirubin, Direct: 0.15 mg/dL (ref 0.00–0.40)
Total Protein: 7.4 g/dL (ref 6.0–8.5)

## 2021-09-11 LAB — LIPID PANEL
Chol/HDL Ratio: 1.6 ratio (ref 0.0–4.4)
Cholesterol, Total: 135 mg/dL (ref 100–199)
HDL: 86 mg/dL (ref >39)
LDL Chol Calc (NIH): 37 mg/dL (ref 0–99)
Triglycerides: 56 mg/dL (ref 0–149)
VLDL Cholesterol Cal: 12 mg/dL (ref 5–40)

## 2021-09-11 NOTE — Progress Notes (Signed)
Cardiology Office Note:   Date:  09/12/2021  NAME:  Kristina Nolan    MRN: [id number] DOB:  August 26, 1956   PCP:  Sheliah Hatch, MD  Cardiologist:  None  Electrophysiologist:  None   Referring MD: Sheliah Hatch, MD   Chief Complaint  Patient presents with   Coronary Artery Disease        History of Present Illness:   Kristina Nolan is a 65 y.o. female with a hx of CAD s/p NSTEMI, HLD, LBBB, ischemic CM (EF 45-50%) who presents for follow-up. LDL not at goal on statin. Started on repatha. LDL 37. Liver enzymes >3x ULN.  She reports she is doing well.  She does get short of breath with exertion but this is with hills.  I have encouraged her to remain active.  She denies any chest pain or chest pressure.  She is noticing bruising and bleeding on DAPT.  We discussed stopping her Brilinta on October 19, 2021.  Her liver enzymes were elevated.  This is 3 times the upper limits of normal.  I recommended to stop the statin.  Her LDL cholesterol is very much at goal on Repatha.  She likely will need to stay on this.  We discussed adding Zetia versus rechecking lipids in 4 weeks.  She is off to recheck this.  Her blood pressure is 120/74.  She notices no significant dizziness or lightheadedness.  Overall seems to be doing well.  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  This has relieved a lot of stress.  They are also building a beach house now.  Overall seems to be doing well.  Cholesterol at goal but needs to stop statin.  AST 138 ALT 128 AP 155  Problem List 1. NSTEMI 10/19/2020 -95% mRCA -> PCI -100% OM1 -> R to L collaterals -30% pLAD -60% dLCX 2. HLD -T chol 135, HDL 86, LDL 37, TG 56 -elevated liver enzymes 09/11/2021 -> >3x ULN (lipitor 80 stopped) 3. LBBB 4. Ischemic CM -EF 45-50% (inferior/inferolateral WMA)  Past Medical History: Past Medical History:  Diagnosis Date   Graves' disease    Hyperlipidemia    Hyperthyroidism    Migraines    NSTEMI  (non-ST elevated myocardial infarction) (HCC) 10/19/2020    Past Surgical History: Past Surgical History:  Procedure Laterality Date   BREAST ENHANCEMENT SURGERY     CORONARY STENT INTERVENTION N/A 10/19/2020   Procedure: CORONARY STENT INTERVENTION;  Surgeon: Yvonne Kendall, MD;  Location: [facility];  Service: Cardiovascular;  Laterality: N/A;   LEFT HEART CATH AND CORONARY ANGIOGRAPHY N/A 10/19/2020   Procedure: LEFT HEART CATH AND CORONARY ANGIOGRAPHY;  Surgeon: Yvonne Kendall, MD;  Location: [facility];  Service: Cardiovascular;  Laterality: N/A;   TUBAL LIGATION      Current Medications: Current Meds  Medication Sig   aspirin 81 MG chewable tablet Chew 1 tablet (81 mg total) by mouth daily.   cetirizine (ZYRTEC) 10 MG tablet Take 1 tablet (10 mg total) by mouth daily.   Evolocumab (REPATHA SURECLICK) 140 MG/ML SOAJ Inject 140 mg into the skin every 14 (fourteen) days.   fluticasone (FLONASE) 50 MCG/ACT nasal spray Place 2 sprays into both nostrils daily.   losartan (COZAAR) 25 MG tablet Take 0.5 tablets (12.5 mg total) by mouth daily.   metoprolol succinate (TOPROL-XL) 25 MG 24 hr tablet TAKE 1 TABLET (25 MG TOTAL) BY MOUTH AT BEDTIME. TAKE WITH OR IMMEDIATELY FOLLOWING A MEAL.  nitroGLYCERIN (NITROSTAT) 0.4 MG SL tablet Place 1 tablet (0.4 mg total) under the tongue every 5 (five) minutes as needed for chest pain.   ticagrelor (BRILINTA) 90 MG TABS tablet Take 1 tablet (90 mg total) by mouth 2 (two) times daily.   valACYclovir (VALTREX) 500 MG tablet TAKE 1 TABLET(S) EVERY DAY BY ORAL ROUTE.   [DISCONTINUED] atorvastatin (LIPITOR) 80 MG tablet Take 1 tablet (80 mg total) by mouth every evening.     Allergies:    Patient has no known allergies.   Social History: Social History   Socioeconomic History   Marital status: Married    Spouse name: Not on file   Number of children: 2   Years of education: Not on file   Highest education level: Not on file   Occupational History   Occupation: Chief of Staff work  Tobacco Use   Smoking status: Never   Smokeless tobacco: Never  Vaping Use   Vaping Use: Never used  Substance and Sexual Activity   Alcohol use: No   Drug use: No   Sexual activity: Not on file  Other Topics Concern   Not on file  Social History Narrative   Not on file   Social Determinants of Health   Financial Resource Strain: Not on file  Food Insecurity: Not on file  Transportation Needs: Not on file  Physical Activity: Not on file  Stress: Not on file  Social Connections: Not on file     Family History: The patient's family history includes Cancer in her father and mother; Heart attack in her brother and father; Thyroid disease in her cousin.  ROS:   All other ROS reviewed and negative. Pertinent positives noted in the HPI.     EKGs/Labs/Other Studies Reviewed:   The following studies were personally reviewed by me today:  TTE 03/27/2021  1. Left ventricular ejection fraction, by estimation, is 45 to 50%. The  left ventricle has mildly decreased function. The left ventricle  demonstrates regional wall motion abnormalities (see scoring  diagram/findings for description). There is mild left  ventricular hypertrophy. Left ventricular diastolic parameters are  consistent with Grade I diastolic dysfunction (impaired relaxation).   2. Right ventricular systolic function is normal. The right ventricular  size is normal. Tricuspid regurgitation signal is inadequate for assessing  PA pressure.   3. The mitral valve is normal in structure. Trivial mitral valve  regurgitation.   4. The aortic valve was not well visualized. Aortic valve regurgitation  is trivial. Mild aortic valve sclerosis is present, with no evidence of  aortic valve stenosis.   5. The inferior vena cava is normal in size with greater than 50%  respiratory variability, suggesting right atrial pressure of 3 mmHg.   Recent  Labs: 10/19/2020: Hemoglobin 12.6; Platelets 208; TSH 1.071 10/20/2020: BUN 9; Creatinine, Ser 0.75; Potassium 4.4; Sodium 138 09/11/2021: ALT 128   Recent Lipid Panel    Component Value Date/Time   CHOL 135 09/11/2021 1113   TRIG 56 09/11/2021 1113   HDL 86 09/11/2021 1113   CHOLHDL 1.6 09/11/2021 1113   CHOLHDL 2.7 10/19/2020 1525   VLDL 6 10/19/2020 1525   LDLCALC 37 09/11/2021 1113    Physical Exam:   VS:  BP 120/74   Pulse 91   Ht 5\' 5"  (1.651 m)   Wt 146 lb 12.8 oz (66.6 kg)   SpO2 99%   BMI 24.43 kg/m    Wt Readings from Last 3 Encounters:  09/12/21 146 lb 12.8  oz (66.6 kg)  01/29/21 150 lb (68 kg)  12/18/20 148 lb (67.1 kg)    General: Well nourished, well developed, in no acute distress Head: Atraumatic, normal size  Eyes: PEERLA, EOMI  Neck: Supple, no JVD Endocrine: No thryomegaly Cardiac: Normal S1, S2; RRR; no murmurs, rubs, or gallops Lungs: Clear to auscultation bilaterally, no wheezing, rhonchi or rales  Abd: Soft, nontender, no hepatomegaly  Ext: No edema, pulses 2+ Musculoskeletal: No deformities, BUE and BLE strength normal and equal Skin: Warm and dry, no rashes   Neuro: Alert and oriented to person, place, time, and situation, CNII-XII grossly intact, no focal deficits  Psych: Normal mood and affect   ASSESSMENT:   Joceline Hinchcliff is a 65 y.o. female who presents for the following: 1. Coronary artery disease involving native coronary artery of native heart without angina pectoris   2. Hyperlipidemia, unspecified hyperlipidemia type   3. Ischemic cardiomyopathy   4. LBBB (left bundle branch block)     PLAN:   1. Coronary artery disease involving native coronary artery of native heart without angina pectoris 2. Hyperlipidemia, unspecified hyperlipidemia type -Diagnosed with non-STEMI on 10/19/2020.  Underwent PCI to the mid RCA.  She has an occluded OM branch with collaterals.  She has a 60% distal circumflex lesion.  This is been managed  medically. -She will complete 1 year of DAPT.  She may stop ticagrelor on 10/19/2021. -Liver enzymes are 3 times upper limits of normal on Lipitor 80 mg daily.  She is also on Repatha.  I have recommended to stop her Lipitor 80 mg daily.  She will not be able to be on a statin moving forward. -She will come back in 4 weeks for fasting lipid profile.  If needed we will add Zetia. -Continue metoprolol succinate 25 mg daily.  She will continue this 3 years post MI. -She will continue aspirin indefinitely. -Blood pressure at goal. -I have encouraged her to remain active.  She should push herself a little bit.  It is good for her to increase her exercise capacity.  She will work on this.  She has no alarming symptoms of angina.  3. Ischemic cardiomyopathy 4. LBBB (left bundle branch block) -EF 45 to 50%.  She does have regional wall motion abnormalities in the inferior inferior lateral distribution.  This is a small area.  She overall has no symptoms of congestive heart failure.  I recommended continue metoprolol succinate 25 mg daily as well as losartan 25 mg daily.  BP is stable.  Overall she is doing well.  She will see me in 6 months  Disposition: Return in about 6 months (around 03/13/2022).  Medication Adjustments/Labs and Tests Ordered: Current medicines are reviewed at length with the patient today.  Concerns regarding medicines are outlined above.  Orders Placed This Encounter  Procedures   Lipid panel    No orders of the defined types were placed in this encounter.   Patient Instructions  Medication Instructions:  STOP LIPITOR  STOP BRILINTA (10/19/2021)  *If you need a refill on your cardiac medications before your next appointment, please call your pharmacy*   Lab Work: LIPID in 4 weeks (come back fasting, nothing to eat or drink, no lab appointment needed)  If you have labs (blood work) drawn today and your tests are completely normal, you will receive your results only  by: MyChart Message (if you have MyChart) OR A paper copy in the mail If you have any lab test that is  abnormal or we need to change your treatment, we will call you to review the results.   Follow-Up: At [facility], you and your health needs are our priority.  As part of our continuing mission to provide you with exceptional heart care, we have created designated Provider Care Teams.  These Care Teams include your primary Cardiologist (physician) and Advanced Practice Providers (APPs -  Physician Assistants and Nurse Practitioners) who all work together to provide you with the care you need, when you need it.  We recommend signing up for the patient portal called "MyChart".  Sign up information is provided on this After Visit Summary.  MyChart is used to connect with patients for Virtual Visits (Telemedicine).  Patients are able to view lab/test results, encounter notes, upcoming appointments, etc.  Non-urgent messages can be sent to your provider as well.   To learn more about what you can do with MyChart, go to [email or web address].    Your next appointment:   6 month(s)  The format for your next appointment:   In Person  Provider:   Lennie Odor, MD     Time Spent with Patient: I have spent a total of 35 minutes with patient reviewing hospital notes, telemetry, EKGs, labs and examining the patient as well as establishing an assessment and plan that was discussed with the patient.  > 50% of time was spent in direct patient care.  Signed, Lenna Gilford. Flora Lipps, MD, [facility]  [facility]  [address], [address] [place], [place] [address] [phone]  09/12/2021 9:57 AM

## 2021-09-12 ENCOUNTER — Encounter: Payer: Self-pay | Admitting: Cardiovascular Disease

## 2021-09-12 ENCOUNTER — Ambulatory Visit: Payer: BC Managed Care – PPO | Admitting: Cardiovascular Disease

## 2021-09-12 ENCOUNTER — Other Ambulatory Visit: Payer: Self-pay

## 2021-09-12 VITALS — BP 120/74 | HR 91 | Ht 65.0 in | Wt 146.8 lb

## 2021-09-12 DIAGNOSIS — I447 Left bundle-branch block, unspecified: Secondary | ICD-10-CM

## 2021-09-12 DIAGNOSIS — I2510 Atherosclerotic heart disease of native coronary artery without angina pectoris: Secondary | ICD-10-CM

## 2021-09-12 DIAGNOSIS — I255 Ischemic cardiomyopathy: Secondary | ICD-10-CM | POA: Diagnosis not present

## 2021-09-12 DIAGNOSIS — E785 Hyperlipidemia, unspecified: Secondary | ICD-10-CM

## 2021-09-12 NOTE — Patient Instructions (Signed)
Medication Instructions:  STOP LIPITOR  STOP BRILINTA (10/19/2021)  *If you need a refill on your cardiac medications before your next appointment, please call your pharmacy*   Lab Work: LIPID in 4 weeks (come back fasting, nothing to eat or drink, no lab appointment needed)  If you have labs (blood work) drawn today and your tests are completely normal, you will receive your results only by: MyChart Message (if you have MyChart) OR A paper copy in the mail If you have any lab test that is abnormal or we need to change your treatment, we will call you to review the results.   Follow-Up: At [facility], you and your health needs are our priority.  As part of our continuing mission to provide you with exceptional heart care, we have created designated Provider Care Teams.  These Care Teams include your primary Cardiologist (physician) and Advanced Practice Providers (APPs -  Physician Assistants and Nurse Practitioners) who all work together to provide you with the care you need, when you need it.  We recommend signing up for the patient portal called "MyChart".  Sign up information is provided on this After Visit Summary.  MyChart is used to connect with patients for Virtual Visits (Telemedicine).  Patients are able to view lab/test results, encounter notes, upcoming appointments, etc.  Non-urgent messages can be sent to your provider as well.   To learn more about what you can do with MyChart, go to [email or web address].    Your next appointment:   6 month(s)  The format for your next appointment:   In Person  Provider:   Lennie Odor, MD

## 2021-09-18 DIAGNOSIS — L821 Other seborrheic keratosis: Secondary | ICD-10-CM | POA: Diagnosis not present

## 2021-09-18 DIAGNOSIS — Z23 Encounter for immunization: Secondary | ICD-10-CM | POA: Diagnosis not present

## 2021-09-25 ENCOUNTER — Telehealth: Payer: Self-pay

## 2021-09-25 NOTE — Telephone Encounter (Signed)
Called and lmom pt that the repatha was approved and to let us know if they happen to need a refill or anything else and we will be happy to help

## 2021-10-17 ENCOUNTER — Ambulatory Visit (INDEPENDENT_AMBULATORY_CARE_PROVIDER_SITE_OTHER): Payer: BC Managed Care – PPO | Admitting: Family Medicine

## 2021-10-17 ENCOUNTER — Encounter: Payer: Self-pay | Admitting: Family Medicine

## 2021-10-17 VITALS — BP 122/82 | HR 79 | Temp 97.9°F | Resp 16 | Ht 65.0 in | Wt 148.6 lb

## 2021-10-17 DIAGNOSIS — Z1231 Encounter for screening mammogram for malignant neoplasm of breast: Secondary | ICD-10-CM

## 2021-10-17 DIAGNOSIS — Z1211 Encounter for screening for malignant neoplasm of colon: Secondary | ICD-10-CM

## 2021-10-17 DIAGNOSIS — Z114 Encounter for screening for human immunodeficiency virus [HIV]: Secondary | ICD-10-CM | POA: Diagnosis not present

## 2021-10-17 DIAGNOSIS — E785 Hyperlipidemia, unspecified: Secondary | ICD-10-CM

## 2021-10-17 DIAGNOSIS — E559 Vitamin D deficiency, unspecified: Secondary | ICD-10-CM

## 2021-10-17 DIAGNOSIS — Z0000 Encounter for general adult medical examination without abnormal findings: Secondary | ICD-10-CM | POA: Diagnosis not present

## 2021-10-17 DIAGNOSIS — Z23 Encounter for immunization: Secondary | ICD-10-CM

## 2021-10-17 LAB — CBC WITH DIFFERENTIAL/PLATELET
Basophils Absolute: 0.1 10*3/uL (ref 0.0–0.1)
Basophils Relative: 1.8 % (ref 0.0–3.0)
Eosinophils Absolute: 0.1 10*3/uL (ref 0.0–0.7)
Eosinophils Relative: 3.5 % (ref 0.0–5.0)
HCT: 42.2 % (ref 36.0–46.0)
Hemoglobin: 13.9 g/dL (ref 12.0–15.0)
Lymphocytes Relative: 31.2 % (ref 12.0–46.0)
Lymphs Abs: 1.3 10*3/uL (ref 0.7–4.0)
MCHC: 32.9 g/dL (ref 30.0–36.0)
MCV: 87.2 fl (ref 78.0–100.0)
Monocytes Absolute: 0.4 10*3/uL (ref 0.1–1.0)
Monocytes Relative: 9.4 % (ref 3.0–12.0)
Neutro Abs: 2.3 10*3/uL (ref 1.4–7.7)
Neutrophils Relative %: 54.1 % (ref 43.0–77.0)
Platelets: 206 10*3/uL (ref 150.0–400.0)
RBC: 4.84 Mil/uL (ref 3.87–5.11)
RDW: 14.2 % (ref 11.5–15.5)
WBC: 4.2 10*3/uL (ref 4.0–10.5)

## 2021-10-17 LAB — TSH: TSH: 1.36 u[IU]/mL (ref 0.35–5.50)

## 2021-10-17 LAB — HEPATIC FUNCTION PANEL
ALT: 45 U/L — ABNORMAL HIGH (ref 0–35)
AST: 47 U/L — ABNORMAL HIGH (ref 0–37)
Albumin: 4.2 g/dL (ref 3.5–5.2)
Alkaline Phosphatase: 112 U/L (ref 39–117)
Bilirubin, Direct: 0.1 mg/dL (ref 0.0–0.3)
Total Bilirubin: 0.4 mg/dL (ref 0.2–1.2)
Total Protein: 7.6 g/dL (ref 6.0–8.3)

## 2021-10-17 LAB — BASIC METABOLIC PANEL
BUN: 16 mg/dL (ref 6–23)
CO2: 30 mEq/L (ref 19–32)
Calcium: 10 mg/dL (ref 8.4–10.5)
Chloride: 103 mEq/L (ref 96–112)
Creatinine, Ser: 0.77 mg/dL (ref 0.40–1.20)
GFR: 81.1 mL/min (ref >60.00)
Glucose, Bld: 88 mg/dL (ref 70–99)
Potassium: 5 mEq/L (ref 3.5–5.1)
Sodium: 138 mEq/L (ref 135–145)

## 2021-10-17 LAB — VITAMIN D 25 HYDROXY (VIT D DEFICIENCY, FRACTURES): VITD: 45.19 ng/mL (ref 30.00–100.00)

## 2021-10-17 MED ORDER — NITROGLYCERIN 0.4 MG SL SUBL
0.4000 mg | SUBLINGUAL_TABLET | SUBLINGUAL | 0 refills | Status: DC | PRN
Start: 2021-10-17 — End: 2023-11-26

## 2021-10-17 NOTE — Assessment & Plan Note (Signed)
Pt has hx of similar.  Check Vit D and replete prn.

## 2021-10-17 NOTE — Patient Instructions (Addendum)
Follow up in 1 year or as needed We'll notify you of your lab results and make any changes if needed Continue to work on healthy diet and regular exercise- you look great! Call and schedule your mammogram at your convenience Complete and return the cologuard as directed Call with any questions or concerns Stay Safe!  Stay Healthy! Happy New Year!!

## 2021-10-17 NOTE — Progress Notes (Signed)
° °  Subjective:    Patient ID: Kristina Nolan, female    DOB: 1956-08-06, 66 y.o.   MRN: [id number]  HPI CPE- pt is overdue for mammo, colonoscopy.  UTD on DEXA.  Due for PNA vaccine.  UTD on Tdap.  Patient Care Team    Relationship Specialty Notifications Start End  Midge Minium, MD PCP - General Family Medicine  01/12/15   Alden Hipp, MD Consulting Physician Obstetrics and Gynecology  06/13/15   Aliene Altes, MD Referring Physician Internal Medicine  06/13/15     Health Maintenance  Topic Date Due   Pneumonia Vaccine 50+ Years old (1 - PCV) Never done   HIV Screening  Never done   Zoster Vaccines- Shingrix (1 of 2) Never done   COLONOSCOPY (Pts 45-32yrs Insurance coverage will need to be confirmed)  Never done   MAMMOGRAM  02/24/2017   COVID-19 Vaccine (1) 11/02/2021 (Originally 02/18/1957)   INFLUENZA VACCINE  01/03/2022 (Originally 05/06/2021)   TETANUS/TDAP  06/16/2022   DEXA SCAN  Completed   Hepatitis C Screening  Completed   HPV VACCINES  Aged Out      Review of Systems Patient reports no vision/ hearing changes, adenopathy,fever, weight change,  persistant/recurrent hoarseness , swallowing issues, chest pain, palpitations, edema, persistant/recurrent cough, hemoptysis, dyspnea (rest/exertional/paroxysmal nocturnal), gastrointestinal bleeding (melena, rectal bleeding), abdominal pain, significant heartburn, bowel changes, GU symptoms (dysuria, hematuria, incontinence), Gyn symptoms (abnormal  bleeding, pain),  syncope, focal weakness, memory loss, numbness & tingling, skin/hair/nail changes, abnormal bruising or bleeding, anxiety, or depression.   This visit occurred during the SARS-CoV-2 public health emergency.  Safety protocols were in place, including screening questions prior to the visit, additional usage of staff PPE, and extensive cleaning of exam room while observing appropriate contact time as indicated for disinfecting solutions.      Objective:    Physical Exam General Appearance:    Alert, cooperative, no distress, appears stated age  Head:    Normocephalic, without obvious abnormality, atraumatic  Eyes:    PERRL, conjunctiva/corneas clear, EOM's intact, fundi    benign, both eyes  Ears:    Normal TM's and external ear canals, both ears  Nose:   Deferred due to COVID  Throat:   Neck:   Supple, symmetrical, trachea midline, no adenopathy;    Thyroid: no enlargement/tenderness/nodules  Back:     Symmetric, no curvature, ROM normal, no CVA tenderness  Lungs:     Clear to auscultation bilaterally, respirations unlabored  Chest Wall:    No tenderness or deformity   Heart:    Regular rate and rhythm, S1 and S2 normal, no murmur, rub   or gallop  Breast Exam:    Deferred to GYN  Abdomen:     Soft, non-tender, bowel sounds active all four quadrants,    no masses, no organomegaly  Genitalia:    Deferred to GYN  Rectal:    Extremities:   Extremities normal, atraumatic, no cyanosis or edema  Pulses:   2+ and symmetric all extremities  Skin:   Skin color, texture, turgor normal, no rashes or lesions  Lymph nodes:   Cervical, supraclavicular, and axillary nodes normal  Neurologic:   CNII-XII intact, normal strength, sensation and reflexes    throughout          Assessment & Plan:

## 2021-10-17 NOTE — Assessment & Plan Note (Signed)
Pt's PE WNL.  Due for mammo- ordered.  Due for colon cancer screen- prefers cologuard.  Order entered.  UTD on DEXA.  Prevnar 20 given today.  Check labs.  Anticipatory guidance provided.

## 2021-10-17 NOTE — Assessment & Plan Note (Signed)
Chronic problem.  Following w/ lipid clinic.  LFTs were elevated.  Will repeat today.

## 2021-10-18 ENCOUNTER — Telehealth: Payer: Self-pay

## 2021-10-18 LAB — HIV ANTIBODY (ROUTINE TESTING W REFLEX): HIV 1&2 Ab, 4th Generation: NONREACTIVE

## 2021-10-18 NOTE — Telephone Encounter (Signed)
Patient aware of labs.  

## 2021-10-18 NOTE — Telephone Encounter (Signed)
-----   Message from Sheliah Hatch, MD sent at 10/18/2021  7:34 AM EST ----- Labs look great!  Liver enzymes look MUCH better than last month.  No changes at this time

## 2021-10-26 ENCOUNTER — Other Ambulatory Visit: Payer: Self-pay | Admitting: Cardiovascular Disease

## 2021-11-08 ENCOUNTER — Telehealth: Payer: Self-pay

## 2021-11-08 ENCOUNTER — Telehealth: Payer: Self-pay | Admitting: Cardiovascular Disease

## 2021-11-08 DIAGNOSIS — E785 Hyperlipidemia, unspecified: Secondary | ICD-10-CM

## 2021-11-08 DIAGNOSIS — I2510 Atherosclerotic heart disease of native coronary artery without angina pectoris: Secondary | ICD-10-CM

## 2021-11-08 NOTE — Telephone Encounter (Signed)
As of right now patient does not need to be on a new statin.  Per Dr Flora Lipps "-She will come back in 4 weeks for fasting lipid profile.  If needed we will add Zetia"   Order has already been placed.

## 2021-11-08 NOTE — Telephone Encounter (Signed)
Pt c/o medication issue:  1. Name of Medication:    2. How are you currently taking this medication (dosage and times per day)?    3. Are you having a reaction (difficulty breathing--STAT)? no  4. What is your medication issue? Patient calling in to get the new medication she suppose to be taking sent to her pharmacy. Patient was unsure of the name

## 2021-11-08 NOTE — Telephone Encounter (Signed)
Returned call to patient left message on personal voice mail to call back. 

## 2021-11-08 NOTE — Telephone Encounter (Signed)
Spoke to patient she stated she had a recent hepatic panel done and enzymes have returned to normal after stopping Atorvastatin.Stated she was told she needed to be on a different statin after enzymes return to normal.Stated she takes Repatha.Advised I will send message to our Pharmacist.

## 2021-11-08 NOTE — Telephone Encounter (Signed)
Spoke to patient PharmD's advice given.Stated she will have fasting lipid panel done on Mon 2/6.Order placed.

## 2021-11-11 DIAGNOSIS — L720 Epidermal cyst: Secondary | ICD-10-CM | POA: Diagnosis not present

## 2021-11-11 DIAGNOSIS — D2339 Other benign neoplasm of skin of other parts of face: Secondary | ICD-10-CM | POA: Diagnosis not present

## 2021-11-11 NOTE — Telephone Encounter (Cosign Needed)
Called and spoke with patient to remind them to complete cologaurd kit for colorectal cancer screening. Patient has received kit and is agreeable, will attempt to complete within the week, no issues or concerns with the process at this time.

## 2021-11-12 DIAGNOSIS — E785 Hyperlipidemia, unspecified: Secondary | ICD-10-CM | POA: Diagnosis not present

## 2021-11-12 LAB — LIPID PANEL
Chol/HDL Ratio: 1.9 ratio (ref 0.0–4.4)
Cholesterol, Total: 161 mg/dL (ref 100–199)
HDL: 84 mg/dL (ref >39)
LDL Chol Calc (NIH): 63 mg/dL (ref 0–99)
Triglycerides: 73 mg/dL (ref 0–149)
VLDL Cholesterol Cal: 14 mg/dL (ref 5–40)

## 2021-11-22 DIAGNOSIS — Z1211 Encounter for screening for malignant neoplasm of colon: Secondary | ICD-10-CM | POA: Diagnosis not present

## 2021-11-30 LAB — COLOGUARD: COLOGUARD: NEGATIVE

## 2021-12-02 ENCOUNTER — Telehealth: Payer: Self-pay

## 2021-12-02 NOTE — Telephone Encounter (Signed)
-----   Message from Midge Minium, MD sent at 12/02/2021  7:34 AM EST ----- Normal cologuard.  Great news!

## 2021-12-02 NOTE — Telephone Encounter (Signed)
Pt aware of results 

## 2021-12-29 ENCOUNTER — Other Ambulatory Visit: Payer: Self-pay | Admitting: Cardiovascular Disease

## 2022-02-21 ENCOUNTER — Other Ambulatory Visit: Payer: Self-pay | Admitting: Cardiovascular Disease

## 2022-03-06 ENCOUNTER — Telehealth: Payer: Self-pay | Admitting: Cardiovascular Disease

## 2022-03-06 NOTE — Telephone Encounter (Signed)
Called to r/s appt on 06/02 with Dr. Flora Lipps. Appt was scheduled incorrectly. Called patient x2 (03/04/22 and 03/06/22) but patients VM is full. Mychart message sent to patient on 03/04/22 but no response yet.

## 2022-03-07 ENCOUNTER — Ambulatory Visit: Payer: BC Managed Care – PPO | Admitting: Cardiovascular Disease

## 2022-04-10 ENCOUNTER — Encounter: Payer: Self-pay | Admitting: Cardiovascular Disease

## 2022-04-10 MED ORDER — REPATHA SURECLICK 140 MG/ML ~~LOC~~ SOAJ: 140.0000 mg | SUBCUTANEOUS | 0 refills | Status: DC

## 2022-04-28 ENCOUNTER — Other Ambulatory Visit: Payer: Self-pay | Admitting: Cardiovascular Disease

## 2022-05-06 ENCOUNTER — Other Ambulatory Visit: Payer: Self-pay | Admitting: Cardiovascular Disease

## 2022-05-06 DIAGNOSIS — I214 Non-ST elevation (NSTEMI) myocardial infarction: Secondary | ICD-10-CM

## 2022-05-06 DIAGNOSIS — I2510 Atherosclerotic heart disease of native coronary artery without angina pectoris: Secondary | ICD-10-CM

## 2022-06-17 DIAGNOSIS — M25521 Pain in right elbow: Secondary | ICD-10-CM | POA: Diagnosis not present

## 2022-06-17 DIAGNOSIS — S022XXA Fracture of nasal bones, initial encounter for closed fracture: Secondary | ICD-10-CM | POA: Diagnosis not present

## 2022-07-01 DIAGNOSIS — M25521 Pain in right elbow: Secondary | ICD-10-CM | POA: Diagnosis not present

## 2022-07-25 DIAGNOSIS — E785 Hyperlipidemia, unspecified: Secondary | ICD-10-CM | POA: Diagnosis not present

## 2022-07-25 DIAGNOSIS — I2510 Atherosclerotic heart disease of native coronary artery without angina pectoris: Secondary | ICD-10-CM | POA: Diagnosis not present

## 2022-07-25 LAB — LIPID PANEL
Chol/HDL Ratio: 2 ratio (ref 0.0–4.4)
Cholesterol, Total: 152 mg/dL (ref 100–199)
HDL: 77 mg/dL (ref >39)
LDL Chol Calc (NIH): 62 mg/dL (ref 0–99)
Triglycerides: 68 mg/dL (ref 0–149)
VLDL Cholesterol Cal: 13 mg/dL (ref 5–40)

## 2022-07-27 NOTE — Progress Notes (Unsigned)
Cardiology Office Note:   Date:  07/28/2022  NAME:  Kristina Nolan    MRN: [id number] DOB:  November 23, 1955   PCP:  Sheliah Hatch, MD  Cardiologist:  None  Electrophysiologist:  None   Referring MD: Sheliah Hatch, MD   Chief Complaint  Patient presents with   Follow-up   History of Present Illness:   Kristina Nolan is a 66 y.o. female with a hx of CAD, HFmEF, HTN, LBBB who presents for follow-up. Started on repatha due to elevated liver enzymes on statin.   LDL cholesterol at goal on Repatha.  Blood pressure well controlled.  She reports an episode last week of exertional chest pressure.  Symptoms resolved with rest.  She can do regular activity but reports heavy activity elucidated her symptoms.  I have encouraged her to remain active.  We discussed pursuing a stress test to make sure she has no new blockages.  She is in agreement with this.  Overall seems to be doing well.  She has several questions about pursuing surgery.  She has a deviated septum.  She has other issues.  She may proceed to surgery if she needs.  She can complete greater than 4 METS.  Overall doing quite well.  Problem List 1. NSTEMI 10/19/2020 -95% mRCA -> PCI -100% OM1 -> R to L collaterals -30% pLAD -60% dLCX 2. HLD -T chol 152, HDL 77, LDL 62, TG 68 -elevated liver enzymes 09/11/2021 -> >3x ULN (lipitor 80 stopped) 3. LBBB 4. Ischemic CM -EF 45-50% (inferior/inferolateral WMA)  Past Medical History: Past Medical History:  Diagnosis Date   Graves' disease    Hyperlipidemia    Hyperthyroidism    Migraines    NSTEMI (non-ST elevated myocardial infarction) (HCC) 10/19/2020    Past Surgical History: Past Surgical History:  Procedure Laterality Date   BREAST ENHANCEMENT SURGERY     CORONARY STENT INTERVENTION N/A 10/19/2020   Procedure: CORONARY STENT INTERVENTION;  Surgeon: Yvonne Kendall, MD;  Location: [facility];  Service: Cardiovascular;  Laterality: N/A;   LEFT HEART  CATH AND CORONARY ANGIOGRAPHY N/A 10/19/2020   Procedure: LEFT HEART CATH AND CORONARY ANGIOGRAPHY;  Surgeon: Yvonne Kendall, MD;  Location: [facility];  Service: Cardiovascular;  Laterality: N/A;   TUBAL LIGATION      Current Medications: Current Meds  Medication Sig   aspirin 81 MG chewable tablet Chew 1 tablet (81 mg total) by mouth daily.   Evolocumab (REPATHA SURECLICK) 140 MG/ML SOAJ Inject 140 mg into the skin every 14 (fourteen) days.   losartan (COZAAR) 25 MG tablet Take 0.5 tablets (12.5 mg total) by mouth daily.   metoprolol succinate (TOPROL-XL) 25 MG 24 hr tablet TAKE 1 TABLET AT BEDTIME FOLLOWING A MEAL   nitroGLYCERIN (NITROSTAT) 0.4 MG SL tablet Place 1 tablet (0.4 mg total) under the tongue every 5 (five) minutes as needed for chest pain.   valACYclovir (VALTREX) 500 MG tablet TAKE 1 TABLET(S) EVERY DAY BY ORAL ROUTE.     Allergies:    Patient has no known allergies.   Social History: Social History   Socioeconomic History   Marital status: Married    Spouse name: Not on file   Number of children: 2   Years of education: Not on file   Highest education level: Not on file  Occupational History   Occupation: Chief of Staff work  Tobacco Use   Smoking status: Never   Smokeless tobacco: Never  Vaping Use   Vaping  Use: Never used  Substance and Sexual Activity   Alcohol use: No   Drug use: No   Sexual activity: Not on file  Other Topics Concern   Not on file  Social History Narrative   Not on file   Social Determinants of Health   Financial Resource Strain: Not on file  Food Insecurity: Not on file  Transportation Needs: Not on file  Physical Activity: Not on file  Stress: Not on file  Social Connections: Not on file     Family History: The patient's family history includes Cancer in her father and mother; Heart attack in her brother and father; Thyroid disease in her cousin.  ROS:   All other ROS reviewed and negative. Pertinent  positives noted in the HPI.     EKGs/Labs/Other Studies Reviewed:   The following studies were personally reviewed by me today:   Recent Labs: 10/17/2021: ALT 45; BUN 16; Creatinine, Ser 0.77; Hemoglobin 13.9; Platelets 206.0; Potassium 5.0; Sodium 138; TSH 1.36   Recent Lipid Panel    Component Value Date/Time   CHOL 152 07/25/2022 0913   TRIG 68 07/25/2022 0913   HDL 77 07/25/2022 0913   CHOLHDL 2.0 07/25/2022 0913   CHOLHDL 2.7 10/19/2020 1525   VLDL 6 10/19/2020 1525   LDLCALC 62 07/25/2022 0913    Physical Exam:   VS:  BP 130/80 (BP Location: Left Arm, Patient Position: Sitting, Cuff Size: Normal)   Pulse 84   Ht 5\' 5"  (1.651 m)   Wt 163 lb 6.4 oz (74.1 kg)   SpO2 95%   BMI 27.19 kg/m    Wt Readings from Last 3 Encounters:  07/28/22 163 lb 6.4 oz (74.1 kg)  10/17/21 148 lb 9.6 oz (67.4 kg)  09/12/21 146 lb 12.8 oz (66.6 kg)    General: Well nourished, well developed, in no acute distress Head: Atraumatic, normal size  Eyes: PEERLA, EOMI  Neck: Supple, no JVD Endocrine: No thryomegaly Cardiac: Normal S1, S2; RRR; no murmurs, rubs, or gallops Lungs: Clear to auscultation bilaterally, no wheezing, rhonchi or rales  Abd: Soft, nontender, no hepatomegaly  Ext: No edema, pulses 2+ Musculoskeletal: No deformities, BUE and BLE strength normal and equal Skin: Warm and dry, no rashes   Neuro: Alert and oriented to person, place, time, and situation, CNII-XII grossly intact, no focal deficits  Psych: Normal mood and affect   ASSESSMENT:   Kristina Nolan is a 66 y.o. female who presents for the following: 1. Coronary artery disease involving native coronary artery of native heart without angina pectoris   2. Hyperlipidemia, unspecified hyperlipidemia type   3. Ischemic cardiomyopathy   4. LBBB (left bundle branch block)     PLAN:   1. Coronary artery disease involving native coronary artery of native heart without angina pectoris 2. Hyperlipidemia, unspecified  hyperlipidemia type -Non-STEMI in January 2022.  Underwent PCI to the mid RCA.  She has an occluded OM1 with collaterals.  She has a 60% distal circumflex lesion and 30% proximal LAD lesion.  She reports symptoms of exertional chest pressure.  She can complete activity without recurrence of symptoms.  We will proceed with a Lexiscan nuclear medicine stress test to further elucidate her symptoms.  Overall I believe she should continue with exercise and continue current medications.  She is on metoprolol as well as losartan.  Blood pressure is well controlled.  She is on aspirin.  Her LDL is at goal.  Her echocardiogram shows an EF of 45 to  50%.  No symptoms of heart failure.  I encouraged her to remain active.  Overall in my opinion she is doing well.  3. Ischemic cardiomyopathy 4. LBBB (left bundle branch block) -EF 45 to 50%.  With left bundle branch block.  No symptoms of heart failure.  Continue metoprolol succinate 25 mg daily and losartan.   Shared Decision Making/Informed Consent The risks [chest pain, shortness of breath, cardiac arrhythmias, dizziness, blood pressure fluctuations, myocardial infarction, stroke/transient ischemic attack, nausea, vomiting, allergic reaction, radiation exposure, metallic taste sensation and life-threatening complications (estimated to be 1 in 10,000)], benefits (risk stratification, diagnosing coronary artery disease, treatment guidance) and alternatives of a nuclear stress test were discussed in detail with Kristina Nolan and she agrees to proceed.  Disposition: Return in about 1 year (around 07/29/2023).  Medication Adjustments/Labs and Tests Ordered: Current medicines are reviewed at length with the patient today.  Concerns regarding medicines are outlined above.  Orders Placed This Encounter  Procedures   Cardiac Stress Test: Informed Consent Details: Physician/Practitioner Attestation; Transcribe to consent form and obtain patient signature   MYOCARDIAL  PERFUSION IMAGING   No orders of the defined types were placed in this encounter.   Patient Instructions  Medication Instructions:  The current medical regimen is effective;  continue present plan and medications.  *If you need a refill on your cardiac medications before your next appointment, please call your pharmacy*   Testing/Procedures: Your physician has requested that you have a lexiscan myoview. For further information please visit [email or web address]. Please follow instruction sheet, as given.   Follow-Up: At [facility], you and your health needs are our priority.  As part of our continuing mission to provide you with exceptional heart care, we have created designated Provider Care Teams.  These Care Teams include your primary Cardiologist (physician) and Advanced Practice Providers (APPs -  Physician Assistants and Nurse Practitioners) who all work together to provide you with the care you need, when you need it.  We recommend signing up for the patient portal called "MyChart".  Sign up information is provided on this After Visit Summary.  MyChart is used to connect with patients for Virtual Visits (Telemedicine).  Patients are able to view lab/test results, encounter notes, upcoming appointments, etc.  Non-urgent messages can be sent to your provider as well.   To learn more about what you can do with MyChart, go to [email or web address].    Your next appointment:   12 month(s)  The format for your next appointment:   In Person  Provider:   Eleonore Chiquito, MD           Time Spent with Patient: I have spent a total of 35 minutes with patient reviewing hospital notes, telemetry, EKGs, labs and examining the patient as well as establishing an assessment and plan that was discussed with the patient.  > 50% of time was spent in direct patient care.  Signed, Addison Naegeli. Audie Box, MD, [place]  [address], [place],  [place] [address] [phone]  07/28/2022 4:19 PM

## 2022-07-28 ENCOUNTER — Ambulatory Visit: Payer: BC Managed Care – PPO | Attending: Cardiovascular Disease | Admitting: Cardiovascular Disease

## 2022-07-28 ENCOUNTER — Encounter: Payer: Self-pay | Admitting: Cardiovascular Disease

## 2022-07-28 VITALS — BP 130/80 | HR 84 | Ht 65.0 in | Wt 163.4 lb

## 2022-07-28 DIAGNOSIS — I255 Ischemic cardiomyopathy: Secondary | ICD-10-CM

## 2022-07-28 DIAGNOSIS — I2510 Atherosclerotic heart disease of native coronary artery without angina pectoris: Secondary | ICD-10-CM

## 2022-07-28 DIAGNOSIS — I447 Left bundle-branch block, unspecified: Secondary | ICD-10-CM

## 2022-07-28 DIAGNOSIS — E785 Hyperlipidemia, unspecified: Secondary | ICD-10-CM | POA: Diagnosis not present

## 2022-07-28 NOTE — Patient Instructions (Signed)
Medication Instructions:  The current medical regimen is effective;  continue present plan and medications.  *If you need a refill on your cardiac medications before your next appointment, please call your pharmacy*   Testing/Procedures: Your physician has requested that you have a lexiscan myoview. For further information please visit [email or web address]. Please follow instruction sheet, as given.   Follow-Up: At [facility], you and your health needs are our priority.  As part of our continuing mission to provide you with exceptional heart care, we have created designated Provider Care Teams.  These Care Teams include your primary Cardiologist (physician) and Advanced Practice Providers (APPs -  Physician Assistants and Nurse Practitioners) who all work together to provide you with the care you need, when you need it.  We recommend signing up for the patient portal called "MyChart".  Sign up information is provided on this After Visit Summary.  MyChart is used to connect with patients for Virtual Visits (Telemedicine).  Patients are able to view lab/test results, encounter notes, upcoming appointments, etc.  Non-urgent messages can be sent to your provider as well.   To learn more about what you can do with MyChart, go to [email or web address].    Your next appointment:   12 month(s)  The format for your next appointment:   In Person  Provider:   Eleonore Chiquito, MD

## 2022-07-29 DIAGNOSIS — M25521 Pain in right elbow: Secondary | ICD-10-CM | POA: Diagnosis not present

## 2022-08-04 ENCOUNTER — Telehealth (HOSPITAL_COMMUNITY): Payer: Self-pay

## 2022-08-06 NOTE — Telephone Encounter (Signed)
Per DPR left instructions for MPI study on 08/07/22.

## 2022-08-07 ENCOUNTER — Encounter (HOSPITAL_COMMUNITY): Payer: BC Managed Care – PPO

## 2022-09-02 DIAGNOSIS — M950 Acquired deformity of nose: Secondary | ICD-10-CM | POA: Diagnosis not present

## 2022-09-12 ENCOUNTER — Other Ambulatory Visit: Payer: Self-pay | Admitting: Cardiovascular Disease

## 2022-09-15 DIAGNOSIS — L923 Foreign body granuloma of the skin and subcutaneous tissue: Secondary | ICD-10-CM | POA: Diagnosis not present

## 2022-10-18 ENCOUNTER — Other Ambulatory Visit: Payer: Self-pay | Admitting: Cardiovascular Disease

## 2022-10-20 ENCOUNTER — Ambulatory Visit (INDEPENDENT_AMBULATORY_CARE_PROVIDER_SITE_OTHER): Payer: BC Managed Care – PPO | Admitting: Family Medicine

## 2022-10-20 ENCOUNTER — Encounter: Payer: Self-pay | Admitting: Family Medicine

## 2022-10-20 VITALS — BP 122/76 | HR 90 | Temp 98.1°F | Resp 18 | Ht 65.0 in | Wt 160.1 lb

## 2022-10-20 DIAGNOSIS — Z23 Encounter for immunization: Secondary | ICD-10-CM

## 2022-10-20 DIAGNOSIS — E785 Hyperlipidemia, unspecified: Secondary | ICD-10-CM

## 2022-10-20 DIAGNOSIS — Z0000 Encounter for general adult medical examination without abnormal findings: Secondary | ICD-10-CM | POA: Diagnosis not present

## 2022-10-20 DIAGNOSIS — E559 Vitamin D deficiency, unspecified: Secondary | ICD-10-CM | POA: Diagnosis not present

## 2022-10-20 LAB — BASIC METABOLIC PANEL
BUN: 14 mg/dL (ref 6–23)
CO2: 29 mEq/L (ref 19–32)
Calcium: 10.1 mg/dL (ref 8.4–10.5)
Chloride: 102 mEq/L (ref 96–112)
Creatinine, Ser: 0.77 mg/dL (ref 0.40–1.20)
GFR: 80.53 mL/min (ref >60.00)
Glucose, Bld: 83 mg/dL (ref 70–99)
Potassium: 4.7 mEq/L (ref 3.5–5.1)
Sodium: 140 mEq/L (ref 135–145)

## 2022-10-20 LAB — HEPATIC FUNCTION PANEL
ALT: 22 U/L (ref 0–35)
AST: 28 U/L (ref 0–37)
Albumin: 4.4 g/dL (ref 3.5–5.2)
Alkaline Phosphatase: 95 U/L (ref 39–117)
Bilirubin, Direct: 0.1 mg/dL (ref 0.0–0.3)
Total Bilirubin: 0.4 mg/dL (ref 0.2–1.2)
Total Protein: 7.4 g/dL (ref 6.0–8.3)

## 2022-10-20 LAB — LIPID PANEL
Cholesterol: 166 mg/dL (ref 0–200)
HDL: 74.4 mg/dL (ref >39.00)
LDL Cholesterol: 71 mg/dL (ref 0–99)
NonHDL: 91.74
Total CHOL/HDL Ratio: 2
Triglycerides: 105 mg/dL (ref 0.0–149.0)
VLDL: 21 mg/dL (ref 0.0–40.0)

## 2022-10-20 LAB — CBC WITH DIFFERENTIAL/PLATELET
Basophils Absolute: 0.1 10*3/uL (ref 0.0–0.1)
Basophils Relative: 1.6 % (ref 0.0–3.0)
Eosinophils Absolute: 0.2 10*3/uL (ref 0.0–0.7)
Eosinophils Relative: 5.3 % — ABNORMAL HIGH (ref 0.0–5.0)
HCT: 43.8 % (ref 36.0–46.0)
Hemoglobin: 14.7 g/dL (ref 12.0–15.0)
Lymphocytes Relative: 34.5 % (ref 12.0–46.0)
Lymphs Abs: 1.4 10*3/uL (ref 0.7–4.0)
MCHC: 33.6 g/dL (ref 30.0–36.0)
MCV: 83.8 fl (ref 78.0–100.0)
Monocytes Absolute: 0.4 10*3/uL (ref 0.1–1.0)
Monocytes Relative: 10.5 % (ref 3.0–12.0)
Neutro Abs: 1.9 10*3/uL (ref 1.4–7.7)
Neutrophils Relative %: 48.1 % (ref 43.0–77.0)
Platelets: 228 10*3/uL (ref 150.0–400.0)
RBC: 5.22 Mil/uL — ABNORMAL HIGH (ref 3.87–5.11)
RDW: 13 % (ref 11.5–15.5)
WBC: 4 10*3/uL (ref 4.0–10.5)

## 2022-10-20 LAB — VITAMIN D 25 HYDROXY (VIT D DEFICIENCY, FRACTURES): VITD: 28.04 ng/mL — ABNORMAL LOW (ref 30.00–100.00)

## 2022-10-20 LAB — TSH: TSH: 1.27 u[IU]/mL (ref 0.35–5.50)

## 2022-10-20 NOTE — Assessment & Plan Note (Signed)
Chronic problem.  Tolerating statin w/o difficulty.  Check labs.  Adjust meds prn  

## 2022-10-20 NOTE — Assessment & Plan Note (Signed)
Check Vit D and replete prn. 

## 2022-10-20 NOTE — Progress Notes (Signed)
   Subjective:    Patient ID: Kristina Nolan, female    DOB: 1956/02/17, 67 y.o.   MRN: [id number]  HPI CPE- UTD on cologuard, due for mammo and Tdap.  UTD on PNA.  No concerns today.  Patient Care Team    Relationship Specialty Notifications Start End  Kristina Minium, MD PCP - General Family Medicine  01/12/15   Kristina Hipp, MD Consulting Physician Obstetrics and Gynecology  06/13/15   Kristina Altes, MD Referring Physician Internal Medicine  06/13/15      Health Maintenance  Topic Date Due   MAMMOGRAM  02/24/2017   DTaP/Tdap/Td (2 - Td or Tdap) 06/16/2022   Zoster Vaccines- Shingrix (1 of 2) 01/19/2023 (Originally 08/22/1975)   Fecal DNA (Cologuard)  11/22/2024   Pneumonia Vaccine 89+ Years old  Completed   DEXA SCAN  Completed   Hepatitis C Screening  Completed   HPV VACCINES  Aged Out   INFLUENZA VACCINE  Discontinued   COVID-19 Vaccine  Discontinued     Review of Systems Patient reports no vision/ hearing changes, adenopathy,fever, weight change,  persistant/recurrent hoarseness , swallowing issues, chest pain, palpitations, edema, persistant/recurrent cough, hemoptysis, dyspnea (rest/exertional/paroxysmal nocturnal), gastrointestinal bleeding (melena, rectal bleeding), abdominal pain, significant heartburn, bowel changes, GU symptoms (dysuria, hematuria, incontinence), Gyn symptoms (abnormal  bleeding, pain),  syncope, focal weakness, memory loss, numbness & tingling, skin/hair/nail changes, abnormal bruising or bleeding, anxiety, or depression.     Objective:   Physical Exam General Appearance:    Alert, cooperative, no distress, appears stated age  Head:    Normocephalic, without obvious abnormality, atraumatic  Eyes:    PERRL, conjunctiva/corneas clear, EOM's intact both eyes  Ears:    Normal TM's and external ear canals, both ears  Nose:   Nares normal, septum midline, mucosa normal, no drainage    or sinus tenderness  Throat:   Lips, mucosa, and tongue  normal; teeth and gums normal  Neck:   Supple, symmetrical, trachea midline, no adenopathy;    Thyroid: no enlargement/tenderness/nodules  Back:     Symmetric, no curvature, ROM normal, no CVA tenderness  Lungs:     Clear to auscultation bilaterally, respirations unlabored  Chest Wall:    No tenderness or deformity   Heart:    Regular rate and rhythm, S1 and S2 normal, no murmur, rub   or gallop  Breast Exam:    Deferred to GYN  Abdomen:     Soft, non-tender, bowel sounds active all four quadrants,    no masses, no organomegaly  Genitalia:    Deferred to GYN  Rectal:    Extremities:   Extremities normal, atraumatic, no cyanosis or edema  Pulses:   2+ and symmetric all extremities  Skin:   Skin color, texture, turgor normal, no rashes or lesions  Lymph nodes:   Cervical, supraclavicular, and axillary nodes normal  Neurologic:   CNII-XII intact, normal strength, sensation and reflexes    throughout          Assessment & Plan:

## 2022-10-20 NOTE — Assessment & Plan Note (Signed)
Pt's PE WNL.  UTD on PNA, cologuard.  Tdap given today.  Pt to schedule mammo.  Check labs.  Anticipatory guidance provided.

## 2022-10-20 NOTE — Patient Instructions (Signed)
Follow up in 6 months to recheck BP and cholesterol We'll notify you of your lab results and make any changes if needed Keep up the good work!  You look great! Call and schedule your mammogram and have them send me the results Call with any questions or concerns Stay Safe!  Stay Healthy! Happy New Year!!!

## 2022-10-21 ENCOUNTER — Telehealth: Payer: Self-pay

## 2022-10-21 ENCOUNTER — Other Ambulatory Visit: Payer: Self-pay

## 2022-10-21 DIAGNOSIS — E559 Vitamin D deficiency, unspecified: Secondary | ICD-10-CM

## 2022-10-21 MED ORDER — VITAMIN D (ERGOCALCIFEROL) 1.25 MG (50000 UNIT) PO CAPS: 50000.0000 [IU] | ORAL_CAPSULE | ORAL | 12 refills | Status: DC

## 2022-10-21 NOTE — Telephone Encounter (Signed)
-----  Message from Midge Minium, MD sent at 10/21/2022  7:40 AM EST ----- Labs look great w/ exception of low Vit D.  Based on this, we need to start 50,000 units weekly x12 weeks in addition to daily OTC supplement of at least 2000 units.

## 2022-10-21 NOTE — Telephone Encounter (Signed)
Informed pt of lab results sent in Vit D 50,000

## 2022-10-31 ENCOUNTER — Other Ambulatory Visit (HOSPITAL_COMMUNITY): Payer: Self-pay

## 2022-11-03 ENCOUNTER — Telehealth: Payer: Self-pay

## 2022-11-03 NOTE — Telephone Encounter (Signed)
Pharmacy Patient Advocate Encounter  Prior Authorization for Repatha 140MG /ML has been approved.    KEY# [id number] Effective dates: 1.26.24 through 1.24.25

## 2022-12-15 DIAGNOSIS — L821 Other seborrheic keratosis: Secondary | ICD-10-CM | POA: Diagnosis not present

## 2022-12-15 DIAGNOSIS — R202 Paresthesia of skin: Secondary | ICD-10-CM | POA: Diagnosis not present

## 2023-01-21 ENCOUNTER — Other Ambulatory Visit: Payer: Self-pay | Admitting: Cardiovascular Disease

## 2023-02-05 DIAGNOSIS — Z6825 Body mass index (BMI) 25.0-25.9, adult: Secondary | ICD-10-CM | POA: Diagnosis not present

## 2023-02-05 DIAGNOSIS — Z124 Encounter for screening for malignant neoplasm of cervix: Secondary | ICD-10-CM | POA: Diagnosis not present

## 2023-02-05 DIAGNOSIS — Z01419 Encounter for gynecological examination (general) (routine) without abnormal findings: Secondary | ICD-10-CM | POA: Diagnosis not present

## 2023-02-13 ENCOUNTER — Other Ambulatory Visit: Payer: Self-pay | Admitting: Cardiovascular Disease

## 2023-04-20 ENCOUNTER — Encounter: Payer: Self-pay | Admitting: Family Medicine

## 2023-04-20 ENCOUNTER — Ambulatory Visit: Payer: BC Managed Care – PPO | Admitting: Family Medicine

## 2023-04-20 VITALS — BP 118/68 | HR 71 | Temp 97.8°F | Resp 18 | Ht 65.0 in | Wt 161.0 lb

## 2023-04-20 DIAGNOSIS — I255 Ischemic cardiomyopathy: Secondary | ICD-10-CM | POA: Diagnosis not present

## 2023-04-20 DIAGNOSIS — Z789 Other specified health status: Secondary | ICD-10-CM | POA: Insufficient documentation

## 2023-04-20 DIAGNOSIS — E785 Hyperlipidemia, unspecified: Secondary | ICD-10-CM | POA: Diagnosis not present

## 2023-04-20 DIAGNOSIS — E663 Overweight: Secondary | ICD-10-CM | POA: Insufficient documentation

## 2023-04-20 LAB — LIPID PANEL
Cholesterol: 158 mg/dL (ref 0–200)
HDL: 70 mg/dL (ref >39.00)
LDL Cholesterol: 66 mg/dL (ref 0–99)
NonHDL: 88.38
Total CHOL/HDL Ratio: 2
Triglycerides: 111 mg/dL (ref 0.0–149.0)
VLDL: 22.2 mg/dL (ref 0.0–40.0)

## 2023-04-20 LAB — CBC WITH DIFFERENTIAL/PLATELET
Basophils Absolute: 0.1 10*3/uL (ref 0.0–0.1)
Basophils Relative: 1.3 % (ref 0.0–3.0)
Eosinophils Absolute: 0.2 10*3/uL (ref 0.0–0.7)
Eosinophils Relative: 5.3 % — ABNORMAL HIGH (ref 0.0–5.0)
HCT: 42.4 % (ref 36.0–46.0)
Hemoglobin: 13.9 g/dL (ref 12.0–15.0)
Lymphocytes Relative: 28 % (ref 12.0–46.0)
Lymphs Abs: 1.3 10*3/uL (ref 0.7–4.0)
MCHC: 32.9 g/dL (ref 30.0–36.0)
MCV: 84.8 fl (ref 78.0–100.0)
Monocytes Absolute: 0.5 10*3/uL (ref 0.1–1.0)
Monocytes Relative: 10.5 % (ref 3.0–12.0)
Neutro Abs: 2.6 10*3/uL (ref 1.4–7.7)
Neutrophils Relative %: 54.9 % (ref 43.0–77.0)
Platelets: 224 10*3/uL (ref 150.0–400.0)
RBC: 5 Mil/uL (ref 3.87–5.11)
RDW: 13.3 % (ref 11.5–15.5)
WBC: 4.7 10*3/uL (ref 4.0–10.5)

## 2023-04-20 LAB — TSH: TSH: 1.77 u[IU]/mL (ref 0.35–5.50)

## 2023-04-20 LAB — BASIC METABOLIC PANEL
BUN: 12 mg/dL (ref 6–23)
CO2: 30 mEq/L (ref 19–32)
Calcium: 9.9 mg/dL (ref 8.4–10.5)
Chloride: 104 mEq/L (ref 96–112)
Creatinine, Ser: 0.73 mg/dL (ref 0.40–1.20)
GFR: 85.55 mL/min (ref >60.00)
Glucose, Bld: 88 mg/dL (ref 70–99)
Potassium: 5 mEq/L (ref 3.5–5.1)
Sodium: 140 mEq/L (ref 135–145)

## 2023-04-20 LAB — HEPATIC FUNCTION PANEL
ALT: 36 U/L — ABNORMAL HIGH (ref 0–35)
AST: 36 U/L (ref 0–37)
Albumin: 4.1 g/dL (ref 3.5–5.2)
Alkaline Phosphatase: 105 U/L (ref 39–117)
Bilirubin, Direct: 0 mg/dL (ref 0.0–0.3)
Total Bilirubin: 0.3 mg/dL (ref 0.2–1.2)
Total Protein: 7.4 g/dL (ref 6.0–8.3)

## 2023-04-20 NOTE — Progress Notes (Signed)
   Subjective:    Patient ID: Kristina Nolan, female    DOB: 1956/07/20, 67 y.o.   MRN: [id number]  HPI Hyperlipidemia- chronic problem, on Repatha 140mg  q14 days.  No abd pain, N/V.  Ischemic Cardiomyopathy- ongoing issue, currently on Losartan 12.5mg  daily, Metoprolol XL 25mg  daily.  Follows w/ Cardiology.  No CP, SOB, edema.  Overweight- pt doesn't like her weight and is not comfortable in her body.  Pt has decreased her intake but is not doing any regular exercise.   Review of Systems For ROS see HPI     Objective:   Physical Exam Vitals reviewed.  Constitutional:      General: She is not in acute distress.    Appearance: Normal appearance. She is well-developed. She is not ill-appearing.  HENT:     Head: Normocephalic and atraumatic.  Eyes:     Conjunctiva/sclera: Conjunctivae normal.     Pupils: Pupils are equal, round, and reactive to light.  Neck:     Thyroid: No thyromegaly.  Cardiovascular:     Rate and Rhythm: Normal rate and regular rhythm.     Pulses: Normal pulses.     Heart sounds: Normal heart sounds. No murmur heard. Pulmonary:     Effort: Pulmonary effort is normal. No respiratory distress.     Breath sounds: Normal breath sounds.  Abdominal:     General: There is no distension.     Palpations: Abdomen is soft.     Tenderness: There is no abdominal tenderness.  Musculoskeletal:     Cervical back: Normal range of motion and neck supple.     Right lower leg: No edema.     Left lower leg: No edema.  Lymphadenopathy:     Cervical: No cervical adenopathy.  Skin:    General: Skin is warm and dry.  Neurological:     General: No focal deficit present.     Mental Status: She is alert and oriented to person, place, and time.  Psychiatric:        Mood and Affect: Mood normal.        Behavior: Behavior normal.        Thought Content: Thought content normal.           Assessment & Plan:

## 2023-04-20 NOTE — Assessment & Plan Note (Signed)
Chronic problem.  Currently on Repatha due to statin intolerance.  Check labs and adjust prn.

## 2023-04-20 NOTE — Patient Instructions (Addendum)
Schedule your complete physical in 6 months We'll notify you of your lab results and make any changes if needed Try and add regular exercise to your routine Call with any questions or concerns Stay Safe!  Stay Healthy! Have a great summer!!!

## 2023-04-20 NOTE — Assessment & Plan Note (Signed)
Weight is stable.  BMI 26.79  Pt eats well but does not have any regular exercise.  Encouraged her to add this to her daily routine.  Will follow.

## 2023-04-20 NOTE — Assessment & Plan Note (Signed)
Chronic problem.  Currently on ARB and beta blocker as part of medical management.  Was intolerant to statin (increased LFTs) and now on Repatha.  Following w/ Cards.  Currently asymptomatic.

## 2023-04-20 NOTE — Assessment & Plan Note (Signed)
Statin caused increased LFTs.  On Repatha

## 2023-04-21 ENCOUNTER — Other Ambulatory Visit: Payer: Self-pay | Admitting: Obstetrics

## 2023-04-21 DIAGNOSIS — Z1231 Encounter for screening mammogram for malignant neoplasm of breast: Secondary | ICD-10-CM

## 2023-04-22 ENCOUNTER — Ambulatory Visit
Admission: RE | Admit: 2023-04-22 | Discharge: 2023-04-22 | Disposition: A | Discharge status: Home / Self Care | Payer: BC Managed Care – PPO | Source: Ambulatory Visit | Attending: Obstetrics | Admitting: Obstetrics

## 2023-04-22 ENCOUNTER — Other Ambulatory Visit: Payer: Self-pay | Admitting: Obstetrics

## 2023-04-22 DIAGNOSIS — Z1231 Encounter for screening mammogram for malignant neoplasm of breast: Secondary | ICD-10-CM | POA: Diagnosis not present

## 2023-05-18 ENCOUNTER — Ambulatory Visit: Payer: BC Managed Care – PPO | Admitting: Family Medicine

## 2023-05-18 ENCOUNTER — Encounter: Payer: Self-pay | Admitting: Family Medicine

## 2023-05-18 VITALS — BP 128/70 | HR 68 | Temp 98.0°F | Ht 65.0 in | Wt 162.6 lb

## 2023-05-18 DIAGNOSIS — H9202 Otalgia, left ear: Secondary | ICD-10-CM

## 2023-05-18 MED ORDER — AMOXICILLIN 875 MG PO TABS
875.0000 mg | ORAL_TABLET | Freq: Two times a day (BID) | ORAL | 0 refills | Status: AC
Start: 2023-05-18 — End: 2023-05-28

## 2023-05-18 NOTE — Progress Notes (Signed)
Subjective:  Patient ID: Kristina Nolan, female    DOB: [birth year]  Age: 67 y.o. MRN: [id number]  CC:  Chief Complaint  Patient presents with   Ear Fullness    Pt notes still painful from when Dr Beverely Low saw her, notes painful, took a dose of abx yesterday not today stated it helped     HPI Kristina Nolan presents for   L Ear pain/fullness Noticed dizziness 2 days ago, pain in left hear and left side of head yesterday. Took leftover dose of amoxicillin 500mg  twice yesterday - pressure improved within an hour. Feels full again today.  Able to hear ok. Going out of town tomorrow.  No fever.  No recent swimming, altitude travel or flying.  Sinus pain last week. Better now, no treatments.  Has flonase - not used in past week.     History Patient Active Problem List   Diagnosis Date Noted   Statin intolerance 04/20/2023   Overweight (BMI 25.0-29.9) 04/20/2023   CAD S/P percutaneous coronary angioplasty 10/20/2020   Ischemic cardiomyopathy 10/20/2020   NSTEMI (non-ST elevated myocardial infarction) (HCC) 10/19/2020   Vitamin D deficiency 07/12/2020   Hyperlipidemia 07/08/2018   Graves' disease 08/08/2015   Physical exam 06/13/2015   Hyperthyroidism 02/05/2015   SOB (shortness of breath) on exertion 01/12/2015   Past Medical History:  Diagnosis Date   Graves' disease    Hyperlipidemia    Hyperthyroidism    Migraines    NSTEMI (non-ST elevated myocardial infarction) (HCC) 10/19/2020   Past Surgical History:  Procedure Laterality Date   AUGMENTATION MAMMAPLASTY Bilateral    1980's   BREAST ENHANCEMENT SURGERY     CORONARY STENT INTERVENTION N/A 10/19/2020   Procedure: CORONARY STENT INTERVENTION;  Surgeon: Yvonne Kendall, MD;  Location: [facility];  Service: Cardiovascular;  Laterality: N/A;   LEFT HEART CATH AND CORONARY ANGIOGRAPHY N/A 10/19/2020   Procedure: LEFT HEART CATH AND CORONARY ANGIOGRAPHY;  Surgeon: Yvonne Kendall, MD;  Location: [facility]  INVASIVE CV [facility];  Service: Cardiovascular;  Laterality: N/A;   TUBAL LIGATION     No Known Allergies Prior to Admission medications   Medication Sig Start Date End Date Taking? Authorizing Provider  cetirizine (ZYRTEC) 10 MG tablet Take 1 tablet (10 mg total) by mouth daily. 07/12/20  Yes Sheliah Hatch, MD  Evolocumab (REPATHA SURECLICK) 140 MG/ML SOAJ INJECT 140 MG INTO THE SKIN EVERY 14 (FOURTEEN) DAYS. 02/13/23  Yes O'Neal, Ronnald Ramp, MD  fluticasone [facility]) 50 MCG/ACT nasal spray Place 2 sprays into both nostrils daily. 07/12/20  Yes Sheliah Hatch, MD  losartan (COZAAR) 25 MG tablet TAKE 1/2 TABLET BY MOUTH DAILY 01/21/23  Yes O'Neal, Ronnald Ramp, MD  metoprolol succinate (TOPROL-XL) 25 MG 24 hr tablet TAKE 1 TABLET AT BEDTIME FOLLOWING A MEAL 10/20/22  Yes O'Neal, Ronnald Ramp, MD  nitroGLYCERIN (NITROSTAT) 0.4 MG SL tablet Place 1 tablet (0.4 mg total) under the tongue every 5 (five) minutes as needed for chest pain. 10/17/21  Yes Sheliah Hatch, MD  valACYclovir (VALTREX) 500 MG tablet TAKE 1 TABLET(S) EVERY DAY BY ORAL ROUTE. 02/27/15  Yes [provider]  Vitamin D, Ergocalciferol, (DRISDOL) 1.25 MG (50000 UNIT) CAPS capsule Take 1 capsule (50,000 Units total) by mouth every 7 (seven) days. 10/21/22  Yes Sheliah Hatch, MD   Social History   Socioeconomic History   Marital status: Married    Spouse name: Not on file   Number of children: 2   Years  of education: Not on file   Highest education level: Not on file  Occupational History   Occupation: Chief of Staff work  Tobacco Use   Smoking status: Never   Smokeless tobacco: Never  Vaping Use   Vaping status: Never Used  Substance and Sexual Activity   Alcohol use: No   Drug use: No   Sexual activity: Not on file  Other Topics Concern   Not on file  Social History Narrative   Not on file   Social Determinants of Health   Financial Resource Strain: Not on file  Food Insecurity:  Not on file  Transportation Needs: Not on file  Physical Activity: Not on file  Stress: Not on file  Social Connections: Not on file  Intimate Partner Violence: Not on file    Review of Systems   Objective:   Vitals:   05/18/23 1311  BP: 128/70  Pulse: 68  Temp: 98 F (36.7 C)  TempSrc: Temporal  SpO2: 98%  Weight: 162 lb 9.6 oz (73.8 kg)  Height: 5\' 5"  (1.651 m)     Physical Exam Vitals reviewed.  Constitutional:      General: She is not in acute distress.    Appearance: She is well-developed.  HENT:     Head: Normocephalic and atraumatic.     Right Ear: Hearing, tympanic membrane, ear canal and external ear normal.     Left Ear: Hearing, ear canal and external ear normal.     Ears:     Comments: Pinna, mastoid, external ear nontender.  Canal clear.  Minimal clear fluid at base of TM but no retraction, erythema/injection or rupture.    Nose: Nose normal.     Comments: Sinuses nontender.    Mouth/Throat:     Pharynx: No posterior oropharyngeal erythema.  Eyes:     Conjunctiva/sclera: Conjunctivae normal.     Pupils: Pupils are equal, round, and reactive to light.  Cardiovascular:     Rate and Rhythm: Normal rate and regular rhythm.     Heart sounds: Normal heart sounds. No murmur heard. Pulmonary:     Effort: Pulmonary effort is normal. No respiratory distress.     Breath sounds: Normal breath sounds. No wheezing or rhonchi.  Skin:    General: Skin is warm and dry.     Findings: No rash.  Neurological:     Mental Status: She is alert and oriented to person, place, and time.  Psychiatric:        Mood and Affect: Mood normal.        Behavior: Behavior normal.        Assessment & Plan:  Kristina Nolan is a 67 y.o. female . Left ear pain - Plan: amoxicillin (AMOXIL) 875 MG tablet With previous sinus congestion, possible eustachian tube dysfunction with serous otitis.  Minimal clear fluid on exam without significant erythema at this time.  However she  did take 2 doses of antibiotic yesterday with some improvement of symptoms.  Unsure if status of ear prior to antibiotics.  -Trial of Flonase nasal spray, with option of short-term Afrin but potential side effects and risk were discussed. -If ear pain worsens, antibiotic prescribed for possible early otitis media but less likely at this time and potential side effects and risks of antibiotics were discussed.  RTC precautions.  Meds ordered this encounter  Medications   amoxicillin (AMOXIL) 875 MG tablet    Sig: Take 1 tablet (875 mg total) by mouth 2 (two) times daily for  10 days.    Dispense:  20 tablet    Refill:  0   Patient Instructions  I do not see a definite infection at this time.  If ear pains worsen I did prescribe the antibiotic amoxicillin to be taken twice per day.  I would try using your Flonase nasal spray as that may help with some of the congestion they can create pressure behind the ear.  Hope you continue to improve, let us know if there are questions, and have a safe trip.    Signed,   Meredith Staggers, MD [facility], [facility] 05/18/23 1:31 PM

## 2023-05-18 NOTE — Patient Instructions (Signed)
I do not see a definite infection at this time.  If ear pains worsen I did prescribe the antibiotic amoxicillin to be taken twice per day.  I would try using your Flonase nasal spray as that may help with some of the congestion they can create pressure behind the ear.  Hope you continue to improve, let us know if there are questions, and have a safe trip.

## 2023-06-22 DIAGNOSIS — E854 Organ-limited amyloidosis: Secondary | ICD-10-CM | POA: Diagnosis not present

## 2023-06-22 DIAGNOSIS — R202 Paresthesia of skin: Secondary | ICD-10-CM | POA: Diagnosis not present

## 2023-06-22 DIAGNOSIS — L720 Epidermal cyst: Secondary | ICD-10-CM | POA: Diagnosis not present

## 2023-06-22 DIAGNOSIS — L815 Leukoderma, not elsewhere classified: Secondary | ICD-10-CM | POA: Diagnosis not present

## 2023-07-02 ENCOUNTER — Encounter: Payer: Self-pay | Admitting: Family Medicine

## 2023-07-02 ENCOUNTER — Telehealth: Payer: BC Managed Care – PPO | Admitting: Family Medicine

## 2023-07-02 VITALS — HR 111 | Temp 95.5°F

## 2023-07-02 DIAGNOSIS — R6889 Other general symptoms and signs: Secondary | ICD-10-CM

## 2023-07-02 DIAGNOSIS — J40 Bronchitis, not specified as acute or chronic: Secondary | ICD-10-CM | POA: Diagnosis not present

## 2023-07-02 MED ORDER — PROMETHAZINE-DM 6.25-15 MG/5ML PO SYRP: 5.0000 mL | ORAL_SOLUTION | Freq: Four times a day (QID) | ORAL | 0 refills | Status: DC | PRN

## 2023-07-02 MED ORDER — AZITHROMYCIN 250 MG PO TABS: ORAL_TABLET | ORAL | 0 refills | Status: DC

## 2023-07-02 NOTE — Progress Notes (Signed)
Virtual Visit via Video   I connected with patient on 07/02/23 at  9:20 AM EDT by a video enabled telemedicine application and verified that I am speaking with the correct person using two identifiers.  Location patient: Home Location provider: [place] [facility], Office Persons participating in the virtual visit: Patient, Provider, CMA (Terrah A)  I discussed the limitations of evaluation and management by telemedicine and the availability of in person appointments. The patient expressed understanding and agreed to proceed.  Subjective:   HPI:   Flu like sxs- pt reports flu like illness that started 7 days ago.  Sxs started suddenly on way home from work.  + fever, chills.  Pt feels strength and appetite are improving.  Cough started 3 days ago- cough is burning.  Cough is wet but not productive.  Denies SOB.  Pulse ox reads 92-97%.  No known sick contacts.  Denies sinus pain/pressure.  No HA.    Red bumps- pt reports she has clear vesicles appearing on chest, thighs, arms.  Do not itch.  No burning.  Pt feels they may be due to sweating.    ROS:   See pertinent positives and negatives per HPI.  Patient Active Problem List   Diagnosis Date Noted   Statin intolerance 04/20/2023   Overweight (BMI 25.0-29.9) 04/20/2023   CAD S/P percutaneous coronary angioplasty 10/20/2020   Ischemic cardiomyopathy 10/20/2020   NSTEMI (non-ST elevated myocardial infarction) (HCC) 10/19/2020   Vitamin D deficiency 07/12/2020   Hyperlipidemia 07/08/2018   Graves' disease 08/08/2015   Physical exam 06/13/2015   Hyperthyroidism 02/05/2015   SOB (shortness of breath) on exertion 01/12/2015    Social History   Tobacco Use   Smoking status: Never   Smokeless tobacco: Never  Substance Use Topics   Alcohol use: No    Current Outpatient Medications:    cetirizine (ZYRTEC) 10 MG tablet, Take 1 tablet (10 mg total) by mouth daily., Disp: 30 tablet, Rfl: 11   Evolocumab (REPATHA SURECLICK) 140  MG/ML SOAJ, INJECT 140 MG INTO THE SKIN EVERY 14 (FOURTEEN) DAYS., Disp: 6 mL, Rfl: 3   fluticasone (FLONASE) 50 MCG/ACT nasal spray, Place 2 sprays into both nostrils daily., Disp: 16 mL, Rfl: 6   losartan (COZAAR) 25 MG tablet, TAKE 1/2 TABLET BY MOUTH DAILY, Disp: 45 tablet, Rfl: 2   metoprolol succinate (TOPROL-XL) 25 MG 24 hr tablet, TAKE 1 TABLET AT BEDTIME FOLLOWING A MEAL, Disp: 90 tablet, Rfl: 3   nitroGLYCERIN (NITROSTAT) 0.4 MG SL tablet, Place 1 tablet (0.4 mg total) under the tongue every 5 (five) minutes as needed for chest pain., Disp: 25 tablet, Rfl: 0   valACYclovir (VALTREX) 500 MG tablet, TAKE 1 TABLET(S) EVERY DAY BY ORAL ROUTE., Disp: , Rfl: 10   Vitamin D, Ergocalciferol, (DRISDOL) 1.25 MG (50000 UNIT) CAPS capsule, Take 1 capsule (50,000 Units total) by mouth every 7 (seven) days., Disp: 7 capsule, Rfl: 12  No Known Allergies  Objective:   Pulse (!) 111   Temp (!) 95.5 F (35.3 C) (Temporal)   SpO2 97%  AAOx3, NAD NCAT, EOMI No obvious CN deficits Coloring WNL Pt is able to speak clearly, coherently without shortness of breath or increased work of breathing. + hacking cough Thought process is linear.  Mood is appropriate.   Assessment and Plan:   Flu like sxs- new.  Sxs started 1 week ago.  They sound suspicious for flu or COVID but we are outside the tx window of both.  Reviewed supportive  care and red flags that should prompt return.  Pt expressed understanding and is in agreement w/ plan.   Bronchitis- new.   Pt reports new, burning cough.  Denies SOB or wheezing.  Cough is wet but minimally productive.  Will start Zpack for it's coverage of atypical infxns and also its anti-inflammatory properties.  Cough syrup prn.  Reviewed supportive care and red flags that should prompt return.  Pt expressed understanding and is in agreement w/ plan.    Neena Rhymes, MD 07/02/2023

## 2023-08-06 ENCOUNTER — Other Ambulatory Visit: Payer: Self-pay | Admitting: Cardiovascular Disease

## 2023-08-30 ENCOUNTER — Other Ambulatory Visit: Payer: Self-pay | Admitting: Cardiovascular Disease

## 2023-08-30 NOTE — Progress Notes (Unsigned)
Cardiology Office Note:  .   Date:  09/01/2023  ID:  Kristina Nolan, DOB 1956-10-06, MRN [id number] PCP: Sheliah Hatch, MD  [facility] Providers Cardiologist:  Reatha Harps, MD   History of Present Illness: .   Kristina Nolan is a 67 y.o. female with a history of CAD, HFmrEF, HTN, LBBB, HLD on repatha. Patient is followed by Dr. Flora Lipps and presents today for an annual follow up appointment   Patient previously had NSTEMI in 10/2020. Cardiac catheterization at that time showed 95% stenosis in mid RCA that was treated with DES. There was also 100% disease in OM1 with R to L collaterals, 30% disease in pLAD, 60% disease in dLCx. Echocardiogram showed EF 45-50%, grade I DD, normal RV function. Repeat echocardiogram in 03/2021 showed EF 45-50%, grade I DD, normal RV function. He was started on statin therapy, but patient developed elevated liver enzymes and was transitioned to repatha   Patient was last seen by Dr. Flora Lipps on 07/28/22. At that time, patient complained of exertional chest pressure. She was set up for a nuclear stress test, but she did not get study completed   Today, patient presents for an annual check-up. Overall, she has been doing well from a cardiovascular standpoint. She denies any new or worsening cardiac symptoms or changes in her health status. Reports that last year, she told Dr. Flora Lipps about chest tightness and shortness of breath on exertion. These symptoms only occurred when she walked quickly up a hill or worked harder than usual. A nuclear stress test was ordered, but patient did not get the test completed. Reports that she was nervous about getting the lexiscan medication and her symptom were not very bothersome, so she did not want to undergo the study. She has continued to have stable feelings of tightness in her chest when engaging in strenuous activities, such as walking uphill. This tightness is not associated with pain and resolves quickly. The  patient notes that this symptom has been stable and may be more prevalent in hot weather.  The patient was previously on atorvastatin for cholesterol management but was taken off due to liver test results and joint pain. She is currently on Repatha, which has been effective in lowering their cholesterol, but she would like to try adding a statin to further lower her cholesterol levels.  Patient also expresses concern about weight gain, particularly in the midsection. They report no significant changes in their diet and deny consuming junk food. She also reports an increase in heartburn frequency, which they describe as a typical heartburn sensation without chest pain. They are not currently taking any medication for heartburn and express a preference not to start any.  ROS: Per HPI   Studies Reviewed: .   Cardiac Studies & Procedures   CARDIAC CATHETERIZATION  CARDIAC CATHETERIZATION 10/19/2020  Narrative Conclusions: 1. Severe two-vessel coronary artery disease, including occlusion of small OM1 with left-to-left and right-to-left collaterals suggesting subacute/chronic nature, 60% distal LCx stenosis involving ostium of OM3, and sequential 50% proximal, 95% mid, and 50% mid RCA lesions.  Mild LAD plaquing with heavy mural calcification is also present. 2. Low normal left ventricular systolic function with mildly elevated filling pressure. 3. Successful PCI to 50% proximal and 95% mid RCA lesions with overlapping Resolute Onyx 2.5 x 26 mm and 2.25 x 12 mm drug-eluting stents with 0% residual stenosis and TIMI-3 flow.  Recommendations: 1. Dual antiplatelet therapy with aspirin and ticagrelor for at least 12 months.  2. Aggressive secondary prevention of coronary artery disease. 3. Favor medical therapy of small occluded OM1 branch and distal LCx.  Yvonne Kendall, MD [facility]  Findings Coronary Findings Diagnostic  Dominance: Right  Left Main Vessel is large. Vessel is  angiographically normal. Short LMCA.  Left Anterior Descending Prox LAD lesion is 30% stenosed with 20% stenosed side branch in 1st Diag. The lesion is eccentric. The lesion is severely calcified.  First Diagonal Branch Vessel is moderate in size.  Second Diagonal Branch Vessel is moderate in size. There is moderate disease in the vessel.  Left Circumflex Vessel is moderate in size. Ost Cx to Prox Cx lesion is 40% stenosed. The lesion is eccentric. Mid Cx to Dist Cx lesion is 60% stenosed with 50% stenosed side branch in 3rd Mrg.  First Obtuse Marginal Branch Vessel is small in size. Collaterals 1st Mrg filled by collaterals from 3rd Mrg.  Collaterals 1st Mrg filled by collaterals from RPDA.  1st Mrg lesion is 100% stenosed. The lesion is chronically occluded with right-to-left and left-to-left collateral flow.  Second Obtuse Marginal Branch Vessel is small in size.  Third Obtuse Marginal Branch Vessel is moderate in size.  Fourth Obtuse Marginal Branch Vessel is moderate in size.  Right Coronary Artery Vessel is moderate in size. Prox RCA lesion is 50% stenosed. Mid RCA-1 lesion is 95% stenosed. The lesion is focal and eccentric. Mid RCA-2 lesion is 50% stenosed.  Right Posterior Descending Artery Vessel is moderate in size.  First Right Posterolateral Branch Vessel is small in size.  Intervention  Prox RCA lesion Stent A drug-eluting stent was successfully placed using a STENT RESOLUTE ONYX 2.5X26. Post-Intervention Lesion Assessment The intervention was successful. Pre-interventional TIMI flow is 3. Post-intervention TIMI flow is 3. No complications occurred at this lesion. There is a 0% residual stenosis post intervention.  Mid RCA-1 lesion Stent A drug-eluting stent was successfully placed using a STENT RESOLUTE ONYX 2.25X12. Post-Intervention Lesion Assessment The intervention was successful. Pre-interventional TIMI flow is 3. Post-intervention TIMI  flow is 3. At this lesion, a dissection occurred. There is a 0% residual stenosis post intervention.     ECHOCARDIOGRAM  ECHOCARDIOGRAM COMPLETE 03/27/2021  Narrative ECHOCARDIOGRAM REPORT    Patient Name:   Kristina Nolan Date of Exam: 03/27/2021 Medical Rec #:  [id number]          Height:       65.5 in Accession #:    [id number]         Weight:       150.0 lb Date of Birth:  Dec 06, 1955         BSA:          1.760 m Patient Age:    64 years           BP:           134/70 mmHg Patient Gender: F                  HR:           67 bpm. Exam Location:  [address]  Procedure: 2D Echo, Cardiac Doppler, Color Doppler and Intracardiac Opacification Agent  Indications:    I25.5 Ischemic cardiomyopathy  History:        Patient has prior history of Echocardiogram examinations, most recent 10/20/2020. Cardiomyopathy, Previous Myocardial Infarction and CAD, Arrythmias:LBBB; Risk Factors:Dyslipidemia and Hypertension.  Sonographer:    Garald Braver, RDCS Referring Phys: [id number] [facility]   Sonographer Comments:  Technically difficult study due to poor echo windows and suboptimal apical window. IMPRESSIONS   1. Left ventricular ejection fraction, by estimation, is 45 to 50%. The left ventricle has mildly decreased function. The left ventricle demonstrates regional wall motion abnormalities (see scoring diagram/findings for description). There is mild left ventricular hypertrophy. Left ventricular diastolic parameters are consistent with Grade I diastolic dysfunction (impaired relaxation). 2. Right ventricular systolic function is normal. The right ventricular size is normal. Tricuspid regurgitation signal is inadequate for assessing PA pressure. 3. The mitral valve is normal in structure. Trivial mitral valve regurgitation. 4. The aortic valve was not well visualized. Aortic valve regurgitation is trivial. Mild aortic valve sclerosis is present, with no evidence of aortic  valve stenosis. 5. The inferior vena cava is normal in size with greater than 50% respiratory variability, suggesting right atrial pressure of 3 mmHg.  Comparison(s): 10/10/20 EF 45-50%.  FINDINGS Left Ventricle: Left ventricular ejection fraction, by estimation, is 45 to 50%. The left ventricle has mildly decreased function. The left ventricle demonstrates regional wall motion abnormalities. Definity contrast agent was given IV to delineate the left ventricular endocardial borders. The left ventricular internal cavity size was normal in size. There is mild left ventricular hypertrophy. Left ventricular diastolic parameters are consistent with Grade I diastolic dysfunction (impaired relaxation).   LV Wall Scoring: The mid and distal anterior septum, mid inferoseptal segment, apical inferior segment, and apex are hypokinetic. The entire anterior wall, entire lateral wall, inferior wall, basal anteroseptal segment, and basal inferoseptal segment are normal.  Right Ventricle: The right ventricular size is normal. No increase in right ventricular wall thickness. Right ventricular systolic function is normal. Tricuspid regurgitation signal is inadequate for assessing PA pressure.  Left Atrium: Left atrial size was normal in size.  Right Atrium: Right atrial size was normal in size.  Pericardium: There is no evidence of pericardial effusion.  Mitral Valve: The mitral valve is normal in structure. Mild mitral annular calcification. Trivial mitral valve regurgitation.  Tricuspid Valve: The tricuspid valve is normal in structure. Tricuspid valve regurgitation is trivial.  Aortic Valve: The aortic valve was not well visualized. Aortic valve regurgitation is trivial. Mild aortic valve sclerosis is present, with no evidence of aortic valve stenosis.  Pulmonic Valve: The pulmonic valve was not well visualized. Pulmonic valve regurgitation is trivial.  Aorta: The aortic root and ascending aorta are  structurally normal, with no evidence of dilitation.  Venous: The inferior vena cava is normal in size with greater than 50% respiratory variability, suggesting right atrial pressure of 3 mmHg.  IAS/Shunts: The interatrial septum was not well visualized.   LEFT VENTRICLE PLAX 2D LVIDd:         4.10 cm  Diastology LVIDs:         3.00 cm  LV e' medial:    4.68 cm/s LV PW:         1.10 cm  LV E/e' medial:  17.2 LV IVS:        1.10 cm  LV e' lateral:   6.81 cm/s LVOT diam:     2.00 cm  LV E/e' lateral: 11.8 LV SV:         68 LV SV Index:   39 LVOT Area:     3.14 cm   RIGHT VENTRICLE RV Basal diam:  2.50 cm RV S prime:     9.99 cm/s TAPSE (M-mode): 2.4 cm  LEFT ATRIUM  Index       RIGHT ATRIUM           Index LA diam:        4.00 cm 2.27 cm/m  RA Pressure: 3.00 mmHg LA Vol (A2C):   38.5 ml 21.87 ml/m RA Area:     8.00 cm LA Vol (A4C):   29.5 ml 16.76 ml/m RA Volume:   13.90 ml  7.90 ml/m LA Biplane Vol: 33.5 ml 19.03 ml/m AORTIC VALVE LVOT Vmax:   97.50 cm/s LVOT Vmean:  64.100 cm/s LVOT VTI:    0.218 m  AORTA Ao Root diam: 2.80 cm Ao Asc diam:  3.00 cm  MITRAL VALVE                TRICUSPID VALVE Estimated RAP:  3.00 mmHg  MV E velocity: 80.60 cm/s   SHUNTS MV A velocity: 100.00 cm/s  Systemic VTI:  0.22 m MV E/A ratio:  0.81         Systemic Diam: 2.00 cm  Epifanio Lesches MD Electronically signed by Epifanio Lesches MD Signature Date/Time: 03/27/2021/12:01:32 PM    Final             Risk Assessment/Calculations:             Physical Exam:   VS:  BP 126/84 (BP Location: Right Arm, Patient Position: Sitting, Cuff Size: Normal)   Pulse 92   Ht 5\' 5"  (1.651 m)   Wt 161 lb 3.2 oz (73.1 kg)   SpO2 97%   BMI 26.83 kg/m    Wt Readings from Last 3 Encounters:  09/01/23 161 lb 3.2 oz (73.1 kg)  05/18/23 162 lb 9.6 oz (73.8 kg)  04/20/23 161 lb (73 kg)    GEN: Well nourished, well developed in no acute distress. Sitting comfortably  in the chair  NECK: No JVD; Faint R carotid bruit  CARDIAC:  RRR, no murmurs, rubs, gallops. Radial pulses 2+ bilaterally  RESPIRATORY:  Clear to auscultation without rales, wheezing or rhonchi. Normal work of breathing on room air  ABDOMEN: Soft, non-tender, non-distended EXTREMITIES:  No edema in BLE; No deformity   ASSESSMENT AND PLAN: .    CAD  - Previously had NSTEMI in 10/2020 - cath showed 95% stenosis in mid RCA that was treated with DES. There was also 100% disease in OM1 with R to L collaterals, 30% disease in pLAD, 60% disease in dLCx.  - When last seen in 07/2022, patient complained of exertional shortness of breath and chest tightness. Was scheduled for nuclear stress test but she did not get the study completed. She was worried about receiving the lexiscan medication, and her symptoms were not very bothersome to her. Reports that since that time, she has continued to have some mild chest tightness and shortness of breath when she really exerts herself. Symptoms have been stable over time and are not increasing in frequency or intensity  - Discussed proceeding with nuclear stress test, but patient would prefer to think more about it before proceeding with study. Symptoms are stable and continue to not bother her very much   - Continue repatha, metoprolol succinate 25 mg daily, ASA 81 mg daily   HLD - Patient on repatha- previously had elevated liver enzymes when on atorvastatin  - Lipid panel from 04/2023 showed LDL 66, HDL 70, triglycerides 111, total cholesterol 158  - Continue repatha  - Patient is interested in trying statin therapy again to further reduce cholesterol levels. Start crestor 10  mg daily - Ordered Lipid panel, LFTs to be collected in 8 weeks   Ischemic Cardiomyopathy  LBBB  - EF 45-50% on most recent echo from 03/2021  - Continue metoprolol succinate 25 mg daily and losartan 12.5 mg daily  - She is euvolemic on exam. Denies shortness of breath, orthopnea, ankle  swelling   R carotid Bruit  - Ordered bilateral carotid dopplers   Dispo: Follow up in 1 year   Signed, Jonita Albee, PA-C

## 2023-09-01 ENCOUNTER — Encounter: Payer: Self-pay | Admitting: Cardiology

## 2023-09-01 ENCOUNTER — Ambulatory Visit: Payer: BC Managed Care – PPO | Attending: Cardiology | Admitting: Cardiology

## 2023-09-01 VITALS — BP 126/84 | HR 92 | Ht 65.0 in | Wt 161.2 lb

## 2023-09-01 DIAGNOSIS — I255 Ischemic cardiomyopathy: Secondary | ICD-10-CM

## 2023-09-01 DIAGNOSIS — I10 Essential (primary) hypertension: Secondary | ICD-10-CM

## 2023-09-01 DIAGNOSIS — Z79899 Other long term (current) drug therapy: Secondary | ICD-10-CM

## 2023-09-01 DIAGNOSIS — E785 Hyperlipidemia, unspecified: Secondary | ICD-10-CM

## 2023-09-01 DIAGNOSIS — I2510 Atherosclerotic heart disease of native coronary artery without angina pectoris: Secondary | ICD-10-CM

## 2023-09-01 DIAGNOSIS — R0989 Other specified symptoms and signs involving the circulatory and respiratory systems: Secondary | ICD-10-CM | POA: Diagnosis not present

## 2023-09-01 DIAGNOSIS — I447 Left bundle-branch block, unspecified: Secondary | ICD-10-CM

## 2023-09-01 MED ORDER — ROSUVASTATIN CALCIUM 10 MG PO TABS: 10.0000 mg | ORAL_TABLET | Freq: Every day | ORAL | 3 refills | Status: DC

## 2023-09-01 NOTE — Patient Instructions (Addendum)
Medication Instructions:  Your physician has recommended you make the following change in your medication:  START: Crestor 10 mg once nightly *If you need a refill on your cardiac medications before your next appointment, please call your pharmacy*   Lab Work: TODAY: BMP In 8 weeks: LFT, Lipids If you have labs (blood work) drawn today and your tests are completely normal, you will receive your results only by: MyChart Message (if you have MyChart) OR A paper copy in the mail If you have any lab test that is abnormal or we need to change your treatment, we will call you to review the results.  Testing: Your physician has requested that you have a carotid duplex. This test is an ultrasound of the carotid arteries in your neck. It looks at blood flow through these arteries that supply the brain with blood. Allow one hour for this exam. There are no restrictions or special instructions.  Follow-Up: At [facility], you and your health needs are our priority.  As part of our continuing mission to provide you with exceptional heart care, we have created designated Provider Care Teams.  These Care Teams include your primary Cardiologist (physician) and Advanced Practice Providers (APPs -  Physician Assistants and Nurse Practitioners) who all work together to provide you with the care you need, when you need it.   Your next appointment:   1 year  Provider:   Reatha Harps, MD

## 2023-09-02 LAB — BASIC METABOLIC PANEL
BUN/Creatinine Ratio: 15 (ref 12–28)
BUN: 14 mg/dL (ref 8–27)
CO2: 25 mmol/L (ref 20–29)
Calcium: 10.4 mg/dL — ABNORMAL HIGH (ref 8.7–10.3)
Chloride: 105 mmol/L (ref 96–106)
Creatinine, Ser: 0.94 mg/dL (ref 0.57–1.00)
Glucose: 97 mg/dL (ref 70–99)
Potassium: 5.1 mmol/L (ref 3.5–5.2)
Sodium: 145 mmol/L — ABNORMAL HIGH (ref 134–144)
eGFR: 67 mL/min/{1.73_m2} (ref >59)

## 2023-09-15 ENCOUNTER — Ambulatory Visit (HOSPITAL_COMMUNITY)
Admission: RE | Admit: 2023-09-15 | Discharge: 2023-09-15 | Disposition: A | Discharge status: Home / Self Care | Payer: BC Managed Care – PPO | Source: Ambulatory Visit | Attending: Cardiovascular Disease | Admitting: Cardiovascular Disease

## 2023-09-15 DIAGNOSIS — R0989 Other specified symptoms and signs involving the circulatory and respiratory systems: Secondary | ICD-10-CM | POA: Diagnosis not present

## 2023-09-17 ENCOUNTER — Telehealth: Payer: Self-pay

## 2023-09-17 NOTE — Telephone Encounter (Signed)
Left message to call back  

## 2023-09-17 NOTE — Telephone Encounter (Signed)
-----   Message from Kristina Nolan sent at 09/16/2023 11:32 AM EST ----- Please tell patient that their carotid ultrasounds showed no evidence of disease in her right carotid artery. There is 1-39% stenosis in the left carotid artery, which is mild. This is good news!  Thanks [employer]

## 2023-09-21 NOTE — Telephone Encounter (Signed)
Called patient advised of below they verbalized understanding.

## 2023-10-22 ENCOUNTER — Encounter: Payer: BC Managed Care – PPO | Admitting: Family Medicine

## 2023-11-02 ENCOUNTER — Encounter: Payer: BC Managed Care – PPO | Admitting: Family Medicine

## 2023-11-04 ENCOUNTER — Ambulatory Visit: Payer: BC Managed Care – PPO | Admitting: Family Medicine

## 2023-11-24 ENCOUNTER — Ambulatory Visit (INDEPENDENT_AMBULATORY_CARE_PROVIDER_SITE_OTHER): Payer: BC Managed Care – PPO | Admitting: Family Medicine

## 2023-11-24 ENCOUNTER — Encounter: Payer: Self-pay | Admitting: Family Medicine

## 2023-11-24 VITALS — BP 124/64 | HR 68 | Temp 97.8°F | Ht 65.0 in | Wt 163.5 lb

## 2023-11-24 DIAGNOSIS — Z0000 Encounter for general adult medical examination without abnormal findings: Secondary | ICD-10-CM

## 2023-11-24 DIAGNOSIS — Z23 Encounter for immunization: Secondary | ICD-10-CM | POA: Diagnosis not present

## 2023-11-24 DIAGNOSIS — E785 Hyperlipidemia, unspecified: Secondary | ICD-10-CM | POA: Diagnosis not present

## 2023-11-24 DIAGNOSIS — E559 Vitamin D deficiency, unspecified: Secondary | ICD-10-CM

## 2023-11-24 LAB — LIPID PANEL
Cholesterol: 114 mg/dL (ref 0–200)
HDL: 71.3 mg/dL (ref >39.00)
LDL Cholesterol: 26 mg/dL (ref 0–99)
NonHDL: 42.58
Total CHOL/HDL Ratio: 2
Triglycerides: 82 mg/dL (ref 0.0–149.0)
VLDL: 16.4 mg/dL (ref 0.0–40.0)

## 2023-11-24 LAB — BASIC METABOLIC PANEL
BUN: 11 mg/dL (ref 6–23)
CO2: 30 meq/L (ref 19–32)
Calcium: 9.4 mg/dL (ref 8.4–10.5)
Chloride: 105 meq/L (ref 96–112)
Creatinine, Ser: 0.66 mg/dL (ref 0.40–1.20)
GFR: 90.87 mL/min (ref >60.00)
Glucose, Bld: 92 mg/dL (ref 70–99)
Potassium: 4.9 meq/L (ref 3.5–5.1)
Sodium: 142 meq/L (ref 135–145)

## 2023-11-24 LAB — CBC WITH DIFFERENTIAL/PLATELET
Basophils Absolute: 0.1 10*3/uL (ref 0.0–0.1)
Basophils Relative: 1.3 % (ref 0.0–3.0)
Eosinophils Absolute: 0.2 10*3/uL (ref 0.0–0.7)
Eosinophils Relative: 4.8 % (ref 0.0–5.0)
HCT: 41.8 % (ref 36.0–46.0)
Hemoglobin: 13.6 g/dL (ref 12.0–15.0)
Lymphocytes Relative: 34.9 % (ref 12.0–46.0)
Lymphs Abs: 1.6 10*3/uL (ref 0.7–4.0)
MCHC: 32.6 g/dL (ref 30.0–36.0)
MCV: 86.7 fL (ref 78.0–100.0)
Monocytes Absolute: 0.4 10*3/uL (ref 0.1–1.0)
Monocytes Relative: 9.2 % (ref 3.0–12.0)
Neutro Abs: 2.3 10*3/uL (ref 1.4–7.7)
Neutrophils Relative %: 49.8 % (ref 43.0–77.0)
Platelets: 205 10*3/uL (ref 150.0–400.0)
RBC: 4.82 Mil/uL (ref 3.87–5.11)
RDW: 13.4 % (ref 11.5–15.5)
WBC: 4.7 10*3/uL (ref 4.0–10.5)

## 2023-11-24 LAB — TSH: TSH: 0.89 u[IU]/mL (ref 0.35–5.50)

## 2023-11-24 LAB — HEPATIC FUNCTION PANEL
ALT: 16 U/L (ref 0–35)
AST: 23 U/L (ref 0–37)
Albumin: 4.1 g/dL (ref 3.5–5.2)
Alkaline Phosphatase: 86 U/L (ref 39–117)
Bilirubin, Direct: 0.1 mg/dL (ref 0.0–0.3)
Total Bilirubin: 0.3 mg/dL (ref 0.2–1.2)
Total Protein: 7.2 g/dL (ref 6.0–8.3)

## 2023-11-24 LAB — VITAMIN D 25 HYDROXY (VIT D DEFICIENCY, FRACTURES): VITD: 52 ng/mL (ref 30.00–100.00)

## 2023-11-24 NOTE — Patient Instructions (Signed)
Follow up in 6 months to recheck blood pressure and cholesterol We'll notify you of your lab results and make any changes if needed Keep up the good work on healthy diet and regular exercise- I'm proud of you!!! Call with any questions or concerns Stay Safe!  Stay Healthy!

## 2023-11-24 NOTE — Assessment & Plan Note (Signed)
Pt's PE WNL.  UTD on cologuard, mammo, tdap, PNA.  Flu shot given today.  Check labs.  Anticipatory guidance provided.

## 2023-11-24 NOTE — Progress Notes (Signed)
   Subjective:    Patient ID: Kristina Nolan, female    DOB: November 19, 1955, 68 y.o.   MRN: [id number]  HPI CPE- UTD on mammo, cologuard, Tdap, PNA  Patient Care Team    Relationship Specialty Notifications Start End  Sheliah Hatch, MD PCP - General Family Medicine  01/12/15   O'Neal, Ronnald Ramp, MD PCP - Cardiology Cardiology  08/30/23   Ilda Mori, MD Consulting Physician Obstetrics and Gynecology  06/13/15   Benn Moulder, MD Referring Physician Internal Medicine  06/13/15     Health Maintenance  Topic Date Due   COVID-19 Vaccine (1) Never done   Zoster Vaccines- Shingrix (1 of 2) Never done   INFLUENZA VACCINE  05/07/2023   MAMMOGRAM  04/21/2024   Fecal DNA (Cologuard)  11/22/2024   DTaP/Tdap/Td (3 - Td or Tdap) 10/20/2032   Pneumonia Vaccine 42+ Years old  Completed   DEXA SCAN  Completed   Hepatitis C Screening  Completed   HPV VACCINES  Aged Out     Review of Systems Patient reports no vision/ hearing changes, adenopathy,fever, weight change,  persistant/recurrent hoarseness , swallowing issues, chest pain, palpitations, edema, persistant/recurrent cough, hemoptysis, dyspnea (rest/exertional/paroxysmal nocturnal), gastrointestinal bleeding (melena, rectal bleeding), abdominal pain, bowel changes, GU symptoms (dysuria, hematuria, incontinence), Gyn symptoms (abnormal  bleeding, pain),  syncope, focal weakness, memory loss, numbness & tingling, skin/hair/nail changes, abnormal bruising or bleeding, anxiety, or depression.   + intermittent heartburn    Objective:   Physical Exam General Appearance:    Alert, cooperative, no distress, appears stated age  Head:    Normocephalic, without obvious abnormality, atraumatic  Eyes:    PERRL, conjunctiva/corneas clear, EOM's intact both eyes  Ears:    Normal TM's and external ear canals, both ears  Nose:   Nares normal, septum midline, mucosa normal, no drainage    or sinus tenderness  Throat:   Lips, mucosa, and  tongue normal; teeth and gums normal  Neck:   Supple, symmetrical, trachea midline, no adenopathy;    Thyroid: no enlargement/tenderness/nodules  Back:     Symmetric, no curvature, ROM normal, no CVA tenderness  Lungs:     Clear to auscultation bilaterally, respirations unlabored  Chest Wall:    No tenderness or deformity   Heart:    Regular rate and rhythm, S1 and S2 normal, no murmur, rub   or gallop  Breast Exam:    Deferred to GYN  Abdomen:     Soft, non-tender, bowel sounds active all four quadrants,    no masses, no organomegaly  Genitalia:    Deferred to GYN  Rectal:    Extremities:   Extremities normal, atraumatic, no cyanosis or edema  Pulses:   2+ and symmetric all extremities  Skin:   Skin color, texture, turgor normal, no rashes or lesions  Lymph nodes:   Cervical, supraclavicular, and axillary nodes normal  Neurologic:   CNII-XII intact, normal strength, sensation and reflexes    throughout          Assessment & Plan:

## 2023-11-26 ENCOUNTER — Encounter: Payer: Self-pay | Admitting: Family Medicine

## 2023-11-26 ENCOUNTER — Telehealth: Payer: Self-pay

## 2023-11-26 ENCOUNTER — Other Ambulatory Visit: Payer: Self-pay

## 2023-11-26 MED ORDER — NITROGLYCERIN 0.4 MG SL SUBL: 0.4000 mg | SUBLINGUAL_TABLET | SUBLINGUAL | 0 refills | Status: DC | PRN

## 2023-11-26 NOTE — Telephone Encounter (Signed)
-----   Message from Neena Rhymes sent at 11/26/2023  2:17 PM EST ----- Labs look great!  No changes at this time

## 2023-11-27 ENCOUNTER — Other Ambulatory Visit: Payer: Self-pay | Admitting: Cardiovascular Disease

## 2023-11-27 NOTE — Telephone Encounter (Signed)
Given that her Vit D is in normal range, it would be reasonable to just take OTC 2000 units daily

## 2023-11-27 NOTE — Telephone Encounter (Signed)
 Patient has been informed.

## 2024-01-06 ENCOUNTER — Other Ambulatory Visit: Payer: Self-pay | Admitting: Cardiovascular Disease

## 2024-01-06 DIAGNOSIS — E785 Hyperlipidemia, unspecified: Secondary | ICD-10-CM

## 2024-01-06 DIAGNOSIS — I2510 Atherosclerotic heart disease of native coronary artery without angina pectoris: Secondary | ICD-10-CM

## 2024-01-20 ENCOUNTER — Ambulatory Visit: Payer: Self-pay

## 2024-01-20 NOTE — Telephone Encounter (Signed)
 Chief Complaint: Sinus Drainage  Symptoms: Headache, nasal drainage clear, sneezing, nasal congestion, dizzy Frequency: Constant onset Sunday  Pertinent Negatives: Patient denies fever, chills, earache  Disposition: [] ED /[] Urgent Care (no appt availability in office) / [] Appointment(In office/virtual)/ []  [facility]/ [x] Home Care/ [] Refused Recommended Disposition /[] [facility]/ []  Follow-up with PCP Additional Notes: Patient states she has been sneezing since Sunday with clear drainage from the nose. Today she reports a headache and a brief moment of dizziness that has since gone away. Patient states she does not believe it is a sinus infection but allergies from the pollen. Care advice given with home care recommendations. Patient stated she would try the OTC recommendations but if no improvement she will call to schedule an appointment.    Copied From CRM [id number]. Reason for Triage: Patient called because she is having drainage from Sinus due to allergies. Patient is sneezing coughing has itchy eyes. Patient would like for nurse to call her  Patient is currently in [facility] [place]    Reason for Disposition  [1] Sinus congestion as part of a cold AND [2] present < 10 days  Answer Assessment - Initial Assessment Questions 1. LOCATION: "Where does it hurt?"      Around the nose and forehead 2. ONSET: "When did the sinus pain start?"  (e.g., hours, days)      Sunday  3. SEVERITY: "How bad is the pain?"   (Scale 1-10; mild, moderate or severe)   - MILD (1-3): doesn't interfere with normal activities    - MODERATE (4-7): interferes with normal activities (e.g., work or school) or awakens from sleep   - SEVERE (8-10): excruciating pain and patient unable to do any normal activities        Moderate  4. RECURRENT SYMPTOM: "Have you ever had sinus problems before?" If Yes, ask: "When was the last time?" and "What happened that time?"      Yes, I usually need antibiotics   5. NASAL CONGESTION: "Is the nose blocked?" If Yes, ask: "Can you open it or must you breathe through your mouth?"     Yes 6. NASAL DISCHARGE: "Do you have discharge from your nose?" If so ask, "What color?"     Clear  7. FEVER: "Do you have a fever?" If Yes, ask: "What is it, how was it measured, and when did it start?"      No  8. OTHER SYMPTOMS: "Do you have any other symptoms?" (e.g., sore throat, cough, earache, difficulty breathing)     Dizzy, nasal drainage, sneezing  9. PREGNANCY: "Is there any chance you are pregnant?" "When was your last menstrual period?"     No  Protocols used: Sinus Pain or Congestion-A-AH

## 2024-01-20 NOTE — Telephone Encounter (Signed)
 Patient needs an appointment if she would like us  to treat.

## 2024-01-21 ENCOUNTER — Telehealth: Payer: Self-pay | Admitting: Family Medicine

## 2024-01-21 NOTE — Telephone Encounter (Signed)
 Patient wondering if she can take afrin nasal spray due to heart conditions?

## 2024-01-21 NOTE — Telephone Encounter (Signed)
 Afrin should not be used in a person with her heart conditions. For sinus congestion, it would be safer to use flonase nasal spray twice daily, or a sinus saline spray.

## 2024-01-21 NOTE — Telephone Encounter (Signed)
 Pt called concern about her sinuses and wanted to know if she could take Afrin due to her heart conditions? She mentioned that she has been taking Claritin and that it has been working for her. She just wanted to know about the nasal spray that she was recommended the day of her visit from her "nurse".  Please advise, Thanks

## 2024-01-21 NOTE — Telephone Encounter (Signed)
 Called patient and provided instructions to avoid Afrin, use flonase or saline NS. Understanding expressed.

## 2024-02-08 ENCOUNTER — Encounter: Payer: Self-pay | Admitting: Cardiovascular Disease

## 2024-02-25 ENCOUNTER — Other Ambulatory Visit: Payer: Self-pay | Admitting: Cardiovascular Disease

## 2024-04-06 ENCOUNTER — Other Ambulatory Visit: Payer: Self-pay | Admitting: Obstetrics

## 2024-04-06 DIAGNOSIS — Z1231 Encounter for screening mammogram for malignant neoplasm of breast: Secondary | ICD-10-CM

## 2024-05-11 ENCOUNTER — Ambulatory Visit: Admitting: Family Medicine

## 2024-05-11 ENCOUNTER — Ambulatory Visit
Admission: RE | Admit: 2024-05-11 | Discharge: 2024-05-11 | Disposition: A | Discharge status: Home / Self Care | Source: Ambulatory Visit | Attending: Obstetrics

## 2024-05-11 DIAGNOSIS — Z1231 Encounter for screening mammogram for malignant neoplasm of breast: Secondary | ICD-10-CM | POA: Diagnosis not present

## 2024-05-13 ENCOUNTER — Encounter: Payer: Self-pay | Admitting: Family Medicine

## 2024-05-13 ENCOUNTER — Ambulatory Visit: Admitting: Family Medicine

## 2024-05-13 VITALS — BP 108/70 | HR 74 | Temp 97.9°F | Ht 65.0 in | Wt 160.4 lb

## 2024-05-13 DIAGNOSIS — I10 Essential (primary) hypertension: Secondary | ICD-10-CM | POA: Diagnosis not present

## 2024-05-13 DIAGNOSIS — E785 Hyperlipidemia, unspecified: Secondary | ICD-10-CM | POA: Diagnosis not present

## 2024-05-13 DIAGNOSIS — E559 Vitamin D deficiency, unspecified: Secondary | ICD-10-CM

## 2024-05-13 LAB — BASIC METABOLIC PANEL WITH GFR
BUN: 12 mg/dL (ref 6–23)
CO2: 30 meq/L (ref 19–32)
Calcium: 9.5 mg/dL (ref 8.4–10.5)
Chloride: 103 meq/L (ref 96–112)
Creatinine, Ser: 0.73 mg/dL (ref 0.40–1.20)
GFR: 84.91 mL/min (ref >60.00)
Glucose, Bld: 82 mg/dL (ref 70–99)
Potassium: 5.1 meq/L (ref 3.5–5.1)
Sodium: 141 meq/L (ref 135–145)

## 2024-05-13 LAB — HEPATIC FUNCTION PANEL
ALT: 19 U/L (ref 0–35)
AST: 26 U/L (ref 0–37)
Albumin: 4.2 g/dL (ref 3.5–5.2)
Alkaline Phosphatase: 87 U/L (ref 39–117)
Bilirubin, Direct: 0.1 mg/dL (ref 0.0–0.3)
Total Bilirubin: 0.4 mg/dL (ref 0.2–1.2)
Total Protein: 7.3 g/dL (ref 6.0–8.3)

## 2024-05-13 LAB — CBC WITH DIFFERENTIAL/PLATELET
Basophils Absolute: 0.1 K/uL (ref 0.0–0.1)
Basophils Relative: 1.2 % (ref 0.0–3.0)
Eosinophils Absolute: 0.2 K/uL (ref 0.0–0.7)
Eosinophils Relative: 4.5 % (ref 0.0–5.0)
HCT: 41.8 % (ref 36.0–46.0)
Hemoglobin: 13.7 g/dL (ref 12.0–15.0)
Lymphocytes Relative: 30.3 % (ref 12.0–46.0)
Lymphs Abs: 1.3 K/uL (ref 0.7–4.0)
MCHC: 32.9 g/dL (ref 30.0–36.0)
MCV: 84.5 fl (ref 78.0–100.0)
Monocytes Absolute: 0.5 K/uL (ref 0.1–1.0)
Monocytes Relative: 10.6 % (ref 3.0–12.0)
Neutro Abs: 2.4 K/uL (ref 1.4–7.7)
Neutrophils Relative %: 53.4 % (ref 43.0–77.0)
Platelets: 215 K/uL (ref 150.0–400.0)
RBC: 4.94 Mil/uL (ref 3.87–5.11)
RDW: 13.6 % (ref 11.5–15.5)
WBC: 4.4 K/uL (ref 4.0–10.5)

## 2024-05-13 LAB — LIPID PANEL
Cholesterol: 108 mg/dL (ref 0–200)
HDL: 64.6 mg/dL (ref >39.00)
LDL Cholesterol: 26 mg/dL (ref 0–99)
NonHDL: 43.06
Total CHOL/HDL Ratio: 2
Triglycerides: 83 mg/dL (ref 0.0–149.0)
VLDL: 16.6 mg/dL (ref 0.0–40.0)

## 2024-05-13 LAB — VITAMIN D 25 HYDROXY (VIT D DEFICIENCY, FRACTURES): VITD: 39.89 ng/mL (ref 30.00–100.00)

## 2024-05-13 LAB — TSH: TSH: 1.37 u[IU]/mL (ref 0.35–5.50)

## 2024-05-13 NOTE — Patient Instructions (Signed)
Schedule your complete physical in 6 months We'll notify you of your lab results and make any changes if needed Keep up the good work on healthy diet and regular exercise- you look great! Call with any questions or concerns Stay Safe!  Stay Healthy! Enjoy the rest of your summer!!!

## 2024-05-13 NOTE — Assessment & Plan Note (Signed)
 Repeat labs and replete prn.

## 2024-05-13 NOTE — Progress Notes (Signed)
   Subjective:    Patient ID: Kristina Nolan, female    DOB: 1956-10-06, 68 y.o.   MRN: [id number]  HPI HTN- chronic problem, on Losartan  12.5mg  daily, Metoprolol  25mg  daily w/ good control.  No CP, SOB, HA's, visual changes, edema.  Hyperlipidemia- chronic problem, on Repatha  140mg  q14, Crestor  10mg  daily.  Currently down 5 lbs- attempting to eat better.  No abd pain, N/V.  Vit D deficiency- pt would like level rechecked today   Review of Systems For ROS see HPI     Objective:   Physical Exam Vitals reviewed.  Constitutional:      General: She is not in acute distress.    Appearance: Normal appearance. She is well-developed. She is not ill-appearing.  HENT:     Head: Normocephalic and atraumatic.  Eyes:     Conjunctiva/sclera: Conjunctivae normal.     Pupils: Pupils are equal, round, and reactive to light.  Neck:     Thyroid : No thyromegaly.  Cardiovascular:     Rate and Rhythm: Normal rate and regular rhythm.     Pulses: Normal pulses.     Heart sounds: Normal heart sounds. No murmur heard. Pulmonary:     Effort: Pulmonary effort is normal. No respiratory distress.     Breath sounds: Normal breath sounds.  Abdominal:     General: There is no distension.     Palpations: Abdomen is soft.     Tenderness: There is no abdominal tenderness.  Musculoskeletal:     Cervical back: Normal range of motion and neck supple.     Right lower leg: No edema.     Left lower leg: No edema.  Lymphadenopathy:     Cervical: No cervical adenopathy.  Skin:    General: Skin is warm and dry.  Neurological:     General: No focal deficit present.     Mental Status: She is alert and oriented to person, place, and time.  Psychiatric:        Mood and Affect: Mood normal.        Behavior: Behavior normal.           Assessment & Plan:

## 2024-05-13 NOTE — Assessment & Plan Note (Signed)
 Chronic problem.  On Repatha  and Crestor  w/o difficulty.  She is down 5 lbs and attempting to eat better and be more active.  Check labs.  Adjust meds prn

## 2024-05-13 NOTE — Assessment & Plan Note (Signed)
 Chronic problem.  On Losartan  12.5mg  daily, Metoprolol  25mg  daily w/ good control.  Currently asymptomatic.  Check labs due to ARB use but no anticipated med changes.

## 2024-05-16 ENCOUNTER — Ambulatory Visit: Payer: Self-pay | Admitting: Family Medicine

## 2024-05-24 ENCOUNTER — Telehealth: Payer: Self-pay | Admitting: Pharmacist

## 2024-05-24 NOTE — Telephone Encounter (Signed)
 Repatha  replacement Rx faxed to [facility]

## 2024-06-22 ENCOUNTER — Other Ambulatory Visit: Payer: Self-pay | Admitting: Cardiovascular Disease

## 2024-06-22 DIAGNOSIS — E785 Hyperlipidemia, unspecified: Secondary | ICD-10-CM

## 2024-06-22 DIAGNOSIS — I2510 Atherosclerotic heart disease of native coronary artery without angina pectoris: Secondary | ICD-10-CM

## 2024-08-10 ENCOUNTER — Ambulatory Visit: Admitting: Family

## 2024-08-10 ENCOUNTER — Encounter: Payer: Self-pay | Admitting: Family

## 2024-08-10 ENCOUNTER — Ambulatory Visit: Payer: Self-pay

## 2024-08-10 VITALS — BP 142/90 | HR 82 | Temp 97.0°F | Ht 65.0 in | Wt 161.6 lb

## 2024-08-10 DIAGNOSIS — M545 Low back pain, unspecified: Secondary | ICD-10-CM

## 2024-08-10 DIAGNOSIS — R030 Elevated blood-pressure reading, without diagnosis of hypertension: Secondary | ICD-10-CM

## 2024-08-10 MED ORDER — METHOCARBAMOL 500 MG PO TABS: 500.0000 mg | ORAL_TABLET | Freq: Three times a day (TID) | ORAL | 0 refills | Status: DC | PRN

## 2024-08-10 NOTE — Telephone Encounter (Signed)
 FYI

## 2024-08-10 NOTE — Telephone Encounter (Signed)
 FYI Only or Action Required?: FYI only for provider: appointment today at alternative office.  Patient was last seen in primary care on 05/13/2024 by Mahlon Comer BRAVO, MD.  Called Nurse Triage reporting Back Pain.  Symptoms began 2 days ago.  Interventions attempted: Rest, hydration, or home remedies.  Symptoms are: gradually improving.  Triage Disposition: See PCP When Office is Open (Within 3 Days)  Patient/caregiver understands and will follow disposition?: Yes      Copied from CRM [id number]. Topic: Clinical - Red Word Triage >> Aug 10, 2024 12:39 PM Nessti S wrote: [facility] that prompted transfer to Nurse Triage: pulled muscle on right side (back waist) and has pain when she moves         Reason for Disposition  [1] Age > 50 AND [2] no history of prior similar back pain  Answer Assessment - Initial Assessment Questions 1. ONSET: When did the pain begin? (e.g., minutes, hours, days)     2 days ago  2. LOCATION: Where does it hurt? (upper, mid or lower back)     Right lower back  3. SEVERITY: How bad is the pain?  (e.g., Scale 1-10; mild, moderate, or severe)     8/10 when present  4. PATTERN: Is the pain constant? (e.g., yes, no; constant, intermittent)      Intermittent   5. RADIATION: Does the pain shoot into your legs or somewhere else?     Some very mild radiation to her right leg  6. CAUSE:  What do you think is causing the back pain?      Possible pulled muscle  7. BACK OVERUSE:  Any recent lifting of heavy objects, strenuous work or exercise?     Was exercising  8. MEDICINES: What have you taken so far for the pain? (e.g., nothing, acetaminophen , NSAIDS)     No 9. NEUROLOGIC SYMPTOMS: Do you have any weakness, numbness, or problems with bowel/bladder control?     No 10. OTHER SYMPTOMS: Do you have any other symptoms? (e.g., fever, abdomen pain, burning with urination, blood in urine)       No  Protocols used: Back Pain-A-AH

## 2024-08-10 NOTE — Progress Notes (Signed)
 Patient ID: Kristina Nolan, female    DOB: September 12, 1956, 68 y.o.   MRN: [id number]  Chief Complaint  Patient presents with   Back Pain    Pt c/o right sided lower back pain, present for 2 days. Has tried cold/heat, which did help slightly.   Discussed the use of AI scribe software for clinical note transcription with the patient, who gave verbal consent to proceed.  History of Present Illness Kristina Nolan is a 68 year old female who presents with lower back pain.  She experiences lower back pain on her right side that worsens with movements such as raising her leg or getting up from a higher chair or car seat. It does not hurt if she is still, not moving. The pain is described as a pulling sensation and was worse the previous day. She is concerned about an upcoming trip to an [place] with limited medical care. She applies ice and heat to the affected area. There is no radiation of pain down the leg, and she denies any history of sciatica or back trouble. No tingling or pins and needles sensation, though the pain is throbbing at times. Her past medical history includes myocardial infarction and angioplasty with stent placement. She takes aspirin  daily and avoids NSAIDs due to her heart condition. She has not taken muscle relaxers before and rarely takes medication for migraines. She mentions increased anxiety due to losing her wallet earlier in the week. Her blood pressure is usually around 125-128 mmHg when checked at home.  Assessment & Plan Low back pain Acute low back pain exacerbated by movement. No radiation, tingling, or sciatica history. No tenderness with palpation. Pain management considered in context of cardiac history and current aspirin  therapy. Discussed NSAIDs and muscle relaxants with caution. - Prescribed methocarbamol 500mg -1000mg  for muscle relaxation, adjust for drowsiness. - Recommended naproxen (Aleve) 1 pill twice a day for 3 days to reduce inflammation, with  caution due to cardiac history. - Continue heat application up to 30minutes tid. - Advised Tylenol  Arthritis strength 650mg  up to three times daily for pain relief, can take with above meds or alternate. - Call back if pain is persisting.  Elevated BP Most likely due to stress over upcoming trip, new back pain. Reports normal readings at home. Takes Losartan  25mg  in am and Metoprolol  qpm. - Continue current medication regimen - Ok to take extra Losartan  if BP >135/90 either number. - Drink at least 2 liters of water daily and eat low sodium diet. - F/U with PCP if noticing continued elevated readings.  Subjective:    Outpatient Medications Prior to Visit  Medication Sig Dispense Refill   ASPIRIN  81 PO Take by mouth.     Evolocumab  (REPATHA  SURECLICK) 140 MG/ML SOAJ INJECT 140 MG INTO THE SKIN EVERY 14 (FOURTEEN) DAYS. 6 mL 1   losartan  (COZAAR ) 25 MG tablet TAKE 1/2 TABLET BY MOUTH DAILY 45 tablet 2   metoprolol  succinate (TOPROL -XL) 25 MG 24 hr tablet TAKE 1 TABLET AT BEDTIME FOLLOWING A MEAL 90 tablet 1   nitroGLYCERIN  (NITROSTAT ) 0.4 MG SL tablet Place 1 tablet (0.4 mg total) under the tongue every 5 (five) minutes as needed for chest pain. 25 tablet 0   rosuvastatin  (CRESTOR ) 10 MG tablet Take 1 tablet (10 mg total) by mouth daily. 90 tablet 3   valACYclovir (VALTREX) 500 MG tablet TAKE 1 TABLET(S) EVERY DAY BY ORAL ROUTE.  10   Vitamin D , Ergocalciferol , (DRISDOL ) 1.25 MG (50000 UNIT) CAPS capsule  Take 1 capsule (50,000 Units total) by mouth every 7 (seven) days. (Patient not taking: Reported on 05/13/2024) 7 capsule 12   No facility-administered medications prior to visit.   Past Medical History:  Diagnosis Date   Graves' disease    HTN (hypertension) 05/13/2024   Hyperlipidemia    Hyperthyroidism    Migraines    NSTEMI (non-ST elevated myocardial infarction) (HCC) 10/19/2020   Past Surgical History:  Procedure Laterality Date   AUGMENTATION MAMMAPLASTY Bilateral    1980's    BREAST ENHANCEMENT SURGERY     CORONARY STENT INTERVENTION N/A 10/19/2020   Procedure: CORONARY STENT INTERVENTION;  Surgeon: Mady Bruckner, MD;  Location: [facility];  Service: Cardiovascular;  Laterality: N/A;   LEFT HEART CATH AND CORONARY ANGIOGRAPHY N/A 10/19/2020   Procedure: LEFT HEART CATH AND CORONARY ANGIOGRAPHY;  Surgeon: Mady Bruckner, MD;  Location: [facility];  Service: Cardiovascular;  Laterality: N/A;   TUBAL LIGATION     No Known Allergies    Objective:    Physical Exam Vitals and nursing note reviewed.  Constitutional:      Appearance: Normal appearance.  Cardiovascular:     Rate and Rhythm: Normal rate and regular rhythm.  Pulmonary:     Effort: Pulmonary effort is normal.     Breath sounds: Normal breath sounds.  Abdominal:     Tenderness: There is no right CVA tenderness.  Musculoskeletal:     Lumbar back: No tenderness or bony tenderness. Decreased range of motion (mild).  Skin:    General: Skin is warm and dry.  Neurological:     Mental Status: She is alert.  Psychiatric:        Mood and Affect: Mood normal.        Behavior: Behavior normal.    BP (!) 142/90 (BP Location: Left Arm, Patient Position: Sitting, Cuff Size: Normal)   Pulse 82   Temp (!) 97 F (36.1 C) (Temporal)   Ht 5' 5 (1.651 m)   Wt 161 lb 9.6 oz (73.3 kg)   SpO2 98%   BMI 26.89 kg/m  Wt Readings from Last 3 Encounters:  08/10/24 161 lb 9.6 oz (73.3 kg)  05/13/24 160 lb 6 oz (72.7 kg)  11/24/23 163 lb 8 oz (74.2 kg)      Lucius Krabbe, NP

## 2024-08-24 NOTE — Progress Notes (Deleted)
  Cardiology Office Note:  .   Date:  08/24/2024  ID:  Kristina Nolan, DOB May 30, 1956, MRN [id number] PCP: Mahlon Comer BRAVO, MD  [facility] Providers Cardiologist:  Darryle ONEIDA Decent, MD { Click to update primary MD,subspecialty MD or APP then REFRESH:1}   History of Present Illness: .   No chief complaint on file.   Kristina Nolan is a 68 y.o. female with history of CAD who presents for follow-up.      Problem List 1. NSTEMI 10/19/2020 -95% mRCA -> PCI -100% OM1 -> R to L collaterals -30% pLAD -60% dLCX 2. HLD -T chol 108, HDL 64, LDL 26, TG 83 -elevated liver enzymes 09/11/2021 -> >3x ULN (lipitor  80 stopped) 3. LBBB 4. Ischemic CM -EF 45-50% (inferior/inferolateral WMA)    ROS: All other ROS reviewed and negative. Pertinent positives noted in the HPI.     Studies Reviewed: SABRA       TTE 03/27/2021  1. Left ventricular ejection fraction, by estimation, is 45 to 50%. The  left ventricle has mildly decreased function. The left ventricle  demonstrates regional wall motion abnormalities (see scoring  diagram/findings for description). There is mild left  ventricular hypertrophy. Left ventricular diastolic parameters are  consistent with Grade I diastolic dysfunction (impaired relaxation).   2. Right ventricular systolic function is normal. The right ventricular  size is normal. Tricuspid regurgitation signal is inadequate for assessing  PA pressure.   3. The mitral valve is normal in structure. Trivial mitral valve  regurgitation.   4. The aortic valve was not well visualized. Aortic valve regurgitation  is trivial. Mild aortic valve sclerosis is present, with no evidence of  aortic valve stenosis.   5. The inferior vena cava is normal in size with greater than 50%  respiratory variability, suggesting right atrial pressure of 3 mmHg.  Physical Exam:   VS:  There were no vitals taken for this visit.   Wt Readings from Last 3 Encounters:  08/10/24 161 lb  9.6 oz (73.3 kg)  05/13/24 160 lb 6 oz (72.7 kg)  11/24/23 163 lb 8 oz (74.2 kg)    GEN: Well nourished, well developed in no acute distress NECK: No JVD; No carotid bruits CARDIAC: ***RRR, no murmurs, rubs, gallops RESPIRATORY:  Clear to auscultation without rales, wheezing or rhonchi  ABDOMEN: Soft, non-tender, non-distended EXTREMITIES:  No edema; No deformity  ASSESSMENT AND PLAN: .   ***    {Are you ordering a CV Procedure (e.g. stress test, cath, DCCV, TEE, etc)?   Press F2        :[id number]}   Follow-up: No follow-ups on file.  Signed, Darryle ONEIDA. Decent, MD, [facility]  [facility]  [address] [place], [place] [address] [phone]  8:19 AM

## 2024-08-26 ENCOUNTER — Ambulatory Visit: Admitting: Cardiovascular Disease

## 2024-08-30 ENCOUNTER — Other Ambulatory Visit: Payer: Self-pay | Admitting: Cardiovascular Disease

## 2024-09-05 ENCOUNTER — Other Ambulatory Visit: Payer: Self-pay | Admitting: Cardiovascular Disease

## 2024-09-05 ENCOUNTER — Encounter: Payer: Self-pay | Admitting: Cardiovascular Disease

## 2024-09-05 ENCOUNTER — Other Ambulatory Visit: Payer: Self-pay

## 2024-09-05 ENCOUNTER — Other Ambulatory Visit: Payer: Self-pay | Admitting: Cardiology

## 2024-09-05 MED ORDER — ROSUVASTATIN CALCIUM 10 MG PO TABS: 10.0000 mg | ORAL_TABLET | Freq: Every day | ORAL | 0 refills | Status: DC

## 2024-09-05 MED ORDER — METOPROLOL SUCCINATE ER 25 MG PO TB24: 25.0000 mg | ORAL_TABLET | Freq: Every day | ORAL | 0 refills | Status: DC

## 2024-10-09 NOTE — Progress Notes (Signed)
 " Cardiology Office Note:  .   Date:  10/13/2024  ID:  Kristina Nolan, DOB Jun 18, 1956, MRN [id number] PCP: Mahlon Comer BRAVO, MD  [facility] Providers Cardiologist:  Darryle ONEIDA Decent, MD   History of Present Illness: .    Chief Complaint  Patient presents with   Follow-up         Kristina Nolan is a 69 y.o. female with below history who presents for follow-up.   History of Present Illness   Kristina Nolan is a 69 year old female with coronary artery disease who presents with chest burning sensation during exercise and eating.  She experiences a burning sensation in her chest with an 'acid' quality, associated with exercise and sometimes with eating. The sensation occurs unpredictably with physical activity such as lifting heavy objects and is accompanied by burping or belching. The burning sensation can also occur when she is not active, such as while eating. She can go a week without experiencing these symptoms and has not tried any acid-reducing medications.  She has a history of non-STEMI in 2022 and underwent PCI to the mid RCA. She also has an occluded OM1 with collateral circulation and a 60% stenosis in the distal left circumflex artery. Her ejection fraction is between 45 to 50%. She is currently on a regimen that includes baby aspirin , Repatha , losartan  12.5 mg daily, metoprolol  succinate 25 mg daily, and rosuvastatin  10 mg. Her most recent cholesterol levels included an LDL of 26.  No issues with shortness of breath. She is able to exercise, walking three miles on the treadmill. She retired in November and has been adjusting to a less active lifestyle, which she finds challenging after being very active. She has started exercising as a main focus in her routine.           Problem List 1. NSTEMI 10/19/2020 -95% mRCA -> PCI -100% OM1 -> R to L collaterals -30% pLAD -60% dLCX 2. HLD -T chol 106, HDL 64, LDL 26, TG 83 -elevated liver enzymes 09/11/2021  -> >3x ULN (lipitor  80 stopped) 3. LBBB 4. Ischemic CM -EF 45-50% (inferior/inferolateral WMA)    ROS: All other ROS reviewed and negative. Pertinent positives noted in the HPI.     Studies Reviewed: SABRA   EKG Interpretation Date/Time:  Thursday October 13 2024 08:47:36 EST Ventricular Rate:  79 PR Interval:  172 QRS Duration:  146 QT Interval:  408 QTC Calculation: 467 R Axis:   -53  Text Interpretation: Normal sinus rhythm Left bundle branch block Confirmed by Decent Darryle [phone]) on 10/13/2024 8:49:58 AM   TTE 03/27/2021  1. Left ventricular ejection fraction, by estimation, is 45 to 50%. The  left ventricle has mildly decreased function. The left ventricle  demonstrates regional wall motion abnormalities (see scoring  diagram/findings for description). There is mild left  ventricular hypertrophy. Left ventricular diastolic parameters are  consistent with Grade I diastolic dysfunction (impaired relaxation).   2. Right ventricular systolic function is normal. The right ventricular  size is normal. Tricuspid regurgitation signal is inadequate for assessing  PA pressure.   3. The mitral valve is normal in structure. Trivial mitral valve  regurgitation.   4. The aortic valve was not well visualized. Aortic valve regurgitation  is trivial. Mild aortic valve sclerosis is present, with no evidence of  aortic valve stenosis.   5. The inferior vena cava is normal in size with greater than 50%  respiratory variability, suggesting right atrial pressure of  3 mmHg.  Physical Exam:   VS:  BP (!) 140/90   Pulse 79   Ht 5' 5 (1.651 m)   Wt 162 lb 12.8 oz (73.8 kg)   SpO2 97%   BMI 27.09 kg/m    Wt Readings from Last 3 Encounters:  10/13/24 162 lb 12.8 oz (73.8 kg)  08/10/24 161 lb 9.6 oz (73.3 kg)  05/13/24 160 lb 6 oz (72.7 kg)    GEN: Well nourished, well developed in no acute distress NECK: No JVD; No carotid bruits CARDIAC: RRR, no murmurs, rubs, gallops RESPIRATORY:  Clear  to auscultation without rales, wheezing or rhonchi  ABDOMEN: Soft, non-tender, non-distended EXTREMITIES:  No edema; No deformity  ASSESSMENT AND PLAN: .   Assessment and Plan    Coronary artery disease status post non-STEMI and PCI Non-STEMI in 2022 with PCI to mid RCA. Occluded OM1 with collaterals, 60% distal left circumflex. No current chest pain or heart failure symptoms. EKG shows left bundle branch block. - Ordered cardiac PET scan to assess blood flow. - Continue aspirin  81 mg daily. - Continue Repatha  and rosuvastatin  10 mg daily.  Ischemic cardiomyopathy with reduced ejection fraction (EF 45-50%) EF 45-50%. No heart failure symptoms. - Ordered echocardiogram to reassess heart function.  Left bundle branch block Persistent left bundle branch block on EKG. - Continue current management.  Hypertension Blood pressure 140/90 mmHg. Target 130/80 mmHg. - Continue losartan  12.5 mg daily. - Continue metoprolol  succinate 25 mg daily.  Hyperlipidemia Lipid levels at goal with LDL at 26 mg/dL. - Continue Repatha  and rosuvastatin  10 mg daily.  Suspected gastroesophageal reflux disease Intermittent burning sensation in chest, suggestive of acid reflux. - Consider over-the-counter acid-reducing medication such as omeprazole if symptoms persist.            Informed Consent   Shared Decision Making/Informed Consent The risks [chest pain, shortness of breath, cardiac arrhythmias, dizziness, blood pressure fluctuations, myocardial infarction, stroke/transient ischemic attack, nausea, vomiting, allergic reaction, radiation exposure, metallic taste sensation and life-threatening complications (estimated to be 1 in 10,000)], benefits (risk stratification, diagnosing coronary artery disease, treatment guidance) and alternatives of a cardiac PET stress test were discussed in detail with Ms. Kelsay and she agrees to proceed.      Follow-up: Return in about 1 year (around  10/13/2025).  Signed, Darryle DASEN. Barbaraann, MD, [facility]  [facility]  [address] [place], [place] [address] [phone]  9:24 AM   "

## 2024-10-13 ENCOUNTER — Encounter: Payer: Self-pay | Admitting: Cardiovascular Disease

## 2024-10-13 ENCOUNTER — Other Ambulatory Visit: Payer: Self-pay | Admitting: Cardiovascular Disease

## 2024-10-13 ENCOUNTER — Ambulatory Visit: Attending: Cardiovascular Disease | Admitting: Cardiovascular Disease

## 2024-10-13 VITALS — BP 140/90 | HR 79 | Ht 65.0 in | Wt 162.8 lb

## 2024-10-13 DIAGNOSIS — I255 Ischemic cardiomyopathy: Secondary | ICD-10-CM | POA: Diagnosis not present

## 2024-10-13 DIAGNOSIS — E785 Hyperlipidemia, unspecified: Secondary | ICD-10-CM | POA: Diagnosis not present

## 2024-10-13 DIAGNOSIS — I2510 Atherosclerotic heart disease of native coronary artery without angina pectoris: Secondary | ICD-10-CM

## 2024-10-13 DIAGNOSIS — I25118 Atherosclerotic heart disease of native coronary artery with other forms of angina pectoris: Secondary | ICD-10-CM | POA: Diagnosis not present

## 2024-10-13 DIAGNOSIS — I10 Essential (primary) hypertension: Secondary | ICD-10-CM

## 2024-10-13 NOTE — Patient Instructions (Signed)
 Medication Instructions:  Your physician recommends that you continue on your current medications as directed. Please refer to the Current Medication list given to you today.  *If you need a refill on your cardiac medications before your next appointment, please call your pharmacy*   Testing/Procedures: Your physician has requested that you have an echocardiogram. Echocardiography is a painless test that uses sound waves to create images of your heart. It provides your doctor with information about the size and shape of your heart and how well your hearts chambers and valves are working. This procedure takes approximately one hour. There are no restrictions for this procedure. Please do NOT wear cologne, perfume, aftershave, or lotions (deodorant is allowed). Please arrive 15 minutes prior to your appointment time.  Please note: We ask at that you not bring children with you during ultrasound (echo/ vascular) testing. Due to room size and safety concerns, children are not allowed in the ultrasound rooms during exams. Our front office staff cannot provide observation of children in our lobby area while testing is being conducted. An adult accompanying a patient to their appointment will only be allowed in the ultrasound room at the discretion of the ultrasound technician under special circumstances. We apologize for any inconvenience.   Follow-Up: At [facility], you and your health needs are our priority.  As part of our continuing mission to provide you with exceptional heart care, our providers are all part of one team.  This team includes your primary Cardiologist (physician) and Advanced Practice Providers or APPs (Physician Assistants and Nurse Practitioners) who all work together to provide you with the care you need, when you need it.  Your next appointment:   12 month(s)  Provider:   Darryle ONEIDA Decent, MD or Lum Louis, NP, Aline Door, PA-C, Hao Meng, PA-C, Damien Braver, NP, or Katlyn West, NP          We recommend signing up for the patient portal called MyChart.  Sign up information is provided on this After Visit Summary.  MyChart is used to connect with patients for Virtual Visits (Telemedicine).  Patients are able to view lab/test results, encounter notes, upcoming appointments, etc.  Non-urgent messages can be sent to your provider as well.   To learn more about what you can do with MyChart, go to [email or web address].   Other Instructions    Please report to Radiology at the [facility] Main Entrance 30 minutes early for your test.  [address] [place], [place] [address]                        How to Prepare for Your Cardiac PET/CT Stress Test:  Nothing to eat or drink, except water, 3 hours prior to arrival time.  NO caffeine/decaffeinated products, or chocolate 12 hours prior to arrival. (Please note decaffeinated beverages (teas/coffees) still contain caffeine).  If you have caffeine within 12 hours prior, the test will need to be rescheduled.  Medication instructions: Do not take erectile dysfunction medications for 72 hours prior to test (sildenafil, tadalafil) Do not take nitrates (isosorbide mononitrate, Ranexa) the day before or day of test Do not take tamsulosin the day before or morning of test Hold theophylline containing medications for 12 hours. Hold Dipyridamole 48 hours prior to the test.  Diabetic Preparation: If able to eat breakfast prior to 3 hour fasting, you may take all medications, including your insulin. Do not worry if you miss your breakfast  dose of insulin - start at your next meal. If you do not eat prior to 3 hour fast-Hold all diabetes (oral and insulin) medications. Patients who wear a continuous glucose monitor MUST remove the device prior to scanning.  You may take your remaining medications with water.  NO perfume, cologne or lotion on chest or abdomen area. FEMALES - Please avoid  wearing dresses to this appointment.  Total time is 1 to 2 hours; you may want to bring reading material for the waiting time.  IF YOU THINK YOU MAY BE PREGNANT, OR ARE NURSING PLEASE INFORM THE TECHNOLOGIST.  In preparation for your appointment, medication and supplies will be purchased.  Appointment availability is limited, so if you need to cancel or reschedule, please call the Radiology Department Scheduler at [phone] 24 hours in advance to avoid a cancellation fee of $100.00  What to Expect When you Arrive:  Once you arrive and check in for your appointment, you will be taken to a preparation room within the Radiology Department.  A technologist or Nurse will obtain your medical history, verify that you are correctly prepped for the exam, and explain the procedure.  Afterwards, an IV will be started in your arm and electrodes will be placed on your skin for EKG monitoring during the stress portion of the exam. Then you will be escorted to the PET/CT scanner.  There, staff will get you positioned on the scanner and obtain a blood pressure and EKG.  During the exam, you will continue to be connected to the EKG and blood pressure machines.  A small, safe amount of a radioactive tracer will be injected in your IV to obtain a series of pictures of your heart along with an injection of a stress agent.    After your Exam:  It is recommended that you eat a meal and drink a caffeinated beverage to counter act any effects of the stress agent.  Drink plenty of fluids for the remainder of the day and urinate frequently for the first couple of hours after the exam.  Your doctor will inform you of your test results within 7-10 business days.  For more information and frequently asked questions, please visit our website: [email or web address]  For questions about your test or how to prepare for your test, please call: Cardiac Imaging Nurse Navigators Office: [phone]

## 2024-10-19 ENCOUNTER — Other Ambulatory Visit: Payer: Self-pay | Admitting: Family Medicine

## 2024-11-09 ENCOUNTER — Ambulatory Visit: Admitting: Family Medicine

## 2024-11-09 ENCOUNTER — Encounter: Payer: Self-pay | Admitting: Family Medicine

## 2024-11-09 VITALS — BP 112/60 | HR 70 | Temp 97.7°F | Ht 65.0 in | Wt 162.2 lb

## 2024-11-09 DIAGNOSIS — J069 Acute upper respiratory infection, unspecified: Secondary | ICD-10-CM

## 2024-11-09 NOTE — Patient Instructions (Signed)
 Follow up as needed or as scheduled This appears to be a flu like illness that is running its course START OTC Phenylephrine (decongestant)- this can be taken by itself or in combination medicines like Dayquil/Nyquil, Tylenol  cold and sinus, etc If not already taking, add a daily Claritin  or Zyrtec  Drink LOTS of fluids REST! Call with any questions or concerns Hang in there!!!

## 2024-11-09 NOTE — Progress Notes (Unsigned)
" ° °  Subjective:    Patient ID: Kristina Nolan, female    DOB: 16-Mar-1956, 69 y.o.   MRN: [id number]  HPI Sinus congestion- pt reports sxs started ~1 week ago.  Feels that things are improving today.  Last week had a low grade temp w/ body aches, chills.  Had slight headache.  + nasal congestion.  No ear pain.  Had severe sore throat the day after her temp but this resolved.  + mild cough w/ sneezing.  Denies facial pain/pressure today.  Nasal congestion is clear.     Review of Systems For ROS see HPI     Objective:   Physical Exam        Assessment & Plan:    "

## 2024-11-14 ENCOUNTER — Encounter: Admitting: Family Medicine

## 2024-11-17 ENCOUNTER — Ambulatory Visit (HOSPITAL_COMMUNITY)
